# Patient Record
Sex: Female | Born: 1950 | Race: White | Hispanic: No | Marital: Married | Smoking: Former smoker
Health system: Southern US, Community
[De-identification: ages and names within clinical notes are randomized; demographics above are authoritative.]

## PROBLEM LIST (undated history)

## (undated) DIAGNOSIS — C4490 Unspecified malignant neoplasm of skin, unspecified: Secondary | ICD-10-CM

## (undated) DIAGNOSIS — C50919 Malignant neoplasm of unspecified site of unspecified female breast: Secondary | ICD-10-CM

## (undated) DIAGNOSIS — Z923 Personal history of irradiation: Secondary | ICD-10-CM

## (undated) HISTORY — PX: COLONOSCOPY: SHX174

## (undated) HISTORY — PX: BASAL CELL CARCINOMA EXCISION: SHX1214

## (undated) HISTORY — PX: BREAST LUMPECTOMY: SHX2

---

## 2000-06-16 DIAGNOSIS — C50919 Malignant neoplasm of unspecified site of unspecified female breast: Secondary | ICD-10-CM

## 2000-06-16 HISTORY — PX: BREAST LUMPECTOMY: SHX2

## 2000-06-16 HISTORY — PX: BREAST BIOPSY: SHX20

## 2000-06-16 HISTORY — DX: Malignant neoplasm of unspecified site of unspecified female breast: C50.919

## 2004-10-18 ENCOUNTER — Ambulatory Visit: Payer: Self-pay | Admitting: General Surgery

## 2005-11-17 ENCOUNTER — Ambulatory Visit: Payer: Self-pay | Admitting: General Surgery

## 2007-01-01 ENCOUNTER — Ambulatory Visit: Payer: Self-pay

## 2007-03-30 ENCOUNTER — Ambulatory Visit: Payer: Self-pay | Admitting: General Surgery

## 2007-05-10 ENCOUNTER — Ambulatory Visit: Payer: Self-pay | Admitting: Internal Medicine

## 2007-11-22 ENCOUNTER — Ambulatory Visit: Payer: Self-pay | Admitting: Family Medicine

## 2008-01-03 ENCOUNTER — Ambulatory Visit: Payer: Self-pay | Admitting: General Surgery

## 2008-01-17 ENCOUNTER — Ambulatory Visit: Payer: Self-pay | Admitting: General Surgery

## 2008-06-05 ENCOUNTER — Ambulatory Visit: Payer: Self-pay | Admitting: Internal Medicine

## 2009-01-10 ENCOUNTER — Ambulatory Visit: Payer: Self-pay | Admitting: General Surgery

## 2009-10-23 ENCOUNTER — Ambulatory Visit: Payer: Self-pay | Admitting: Family Medicine

## 2010-01-24 ENCOUNTER — Ambulatory Visit: Payer: Self-pay | Admitting: Family Medicine

## 2011-03-10 ENCOUNTER — Ambulatory Visit: Payer: Self-pay | Admitting: Family Medicine

## 2012-03-25 ENCOUNTER — Ambulatory Visit: Payer: Self-pay | Admitting: Family Medicine

## 2013-05-18 ENCOUNTER — Ambulatory Visit: Payer: Self-pay | Admitting: Family Medicine

## 2014-08-14 ENCOUNTER — Ambulatory Visit: Payer: Self-pay | Admitting: Family Medicine

## 2016-11-13 ENCOUNTER — Other Ambulatory Visit: Payer: Self-pay | Admitting: Family Medicine

## 2016-11-13 DIAGNOSIS — M8580 Other specified disorders of bone density and structure, unspecified site: Secondary | ICD-10-CM

## 2016-11-20 ENCOUNTER — Other Ambulatory Visit: Payer: Self-pay | Admitting: Family Medicine

## 2016-11-20 DIAGNOSIS — Z1231 Encounter for screening mammogram for malignant neoplasm of breast: Secondary | ICD-10-CM

## 2016-11-25 ENCOUNTER — Encounter: Payer: Self-pay | Admitting: Radiology

## 2016-11-25 ENCOUNTER — Ambulatory Visit
Admission: RE | Admit: 2016-11-25 | Discharge: 2016-11-25 | Disposition: A | Discharge status: Home / Self Care | Payer: Medicare HMO | Source: Ambulatory Visit | Attending: Family Medicine | Admitting: Family Medicine

## 2016-11-25 DIAGNOSIS — M8588 Other specified disorders of bone density and structure, other site: Secondary | ICD-10-CM | POA: Insufficient documentation

## 2016-11-25 DIAGNOSIS — M8580 Other specified disorders of bone density and structure, unspecified site: Secondary | ICD-10-CM | POA: Diagnosis present

## 2016-11-25 DIAGNOSIS — M85852 Other specified disorders of bone density and structure, left thigh: Secondary | ICD-10-CM | POA: Diagnosis not present

## 2016-11-25 DIAGNOSIS — Z1231 Encounter for screening mammogram for malignant neoplasm of breast: Secondary | ICD-10-CM | POA: Insufficient documentation

## 2016-11-25 HISTORY — DX: Personal history of irradiation: Z92.3

## 2016-11-25 HISTORY — DX: Malignant neoplasm of unspecified site of unspecified female breast: C50.919

## 2018-03-02 ENCOUNTER — Other Ambulatory Visit: Payer: Self-pay | Admitting: Family Medicine

## 2018-03-02 DIAGNOSIS — Z1231 Encounter for screening mammogram for malignant neoplasm of breast: Secondary | ICD-10-CM

## 2018-03-31 ENCOUNTER — Ambulatory Visit: Payer: Medicare HMO

## 2018-04-07 ENCOUNTER — Encounter (INDEPENDENT_AMBULATORY_CARE_PROVIDER_SITE_OTHER): Payer: Self-pay

## 2018-04-07 ENCOUNTER — Ambulatory Visit
Admission: RE | Admit: 2018-04-07 | Discharge: 2018-04-07 | Disposition: A | Discharge status: Home / Self Care | Payer: Medicare HMO | Source: Ambulatory Visit | Attending: Family Medicine | Admitting: Family Medicine

## 2018-04-07 DIAGNOSIS — Z1231 Encounter for screening mammogram for malignant neoplasm of breast: Secondary | ICD-10-CM | POA: Diagnosis not present

## 2018-04-07 HISTORY — DX: Unspecified malignant neoplasm of skin, unspecified: C44.90

## 2018-05-26 ENCOUNTER — Encounter: Payer: Self-pay | Admitting: *Deleted

## 2018-05-27 ENCOUNTER — Other Ambulatory Visit: Payer: Self-pay

## 2018-05-27 ENCOUNTER — Ambulatory Visit
Admission: RE | Admit: 2018-05-27 | Discharge: 2018-05-27 | Disposition: A | Discharge status: Home / Self Care | Payer: Medicare HMO | Source: Ambulatory Visit | Attending: Gastroenterology | Admitting: Gastroenterology

## 2018-05-27 ENCOUNTER — Encounter
Admission: RE | Disposition: A | Discharge status: Home / Self Care | Payer: Self-pay | Source: Ambulatory Visit | Attending: Gastroenterology

## 2018-05-27 ENCOUNTER — Ambulatory Visit: Payer: Medicare HMO | Admitting: Anesthesiology

## 2018-05-27 ENCOUNTER — Encounter: Payer: Self-pay | Admitting: *Deleted

## 2018-05-27 DIAGNOSIS — Z880 Allergy status to penicillin: Secondary | ICD-10-CM | POA: Diagnosis not present

## 2018-05-27 DIAGNOSIS — Z853 Personal history of malignant neoplasm of breast: Secondary | ICD-10-CM | POA: Diagnosis not present

## 2018-05-27 DIAGNOSIS — Z79899 Other long term (current) drug therapy: Secondary | ICD-10-CM | POA: Insufficient documentation

## 2018-05-27 DIAGNOSIS — Z7982 Long term (current) use of aspirin: Secondary | ICD-10-CM | POA: Diagnosis not present

## 2018-05-27 DIAGNOSIS — K5730 Diverticulosis of large intestine without perforation or abscess without bleeding: Secondary | ICD-10-CM | POA: Insufficient documentation

## 2018-05-27 DIAGNOSIS — K640 First degree hemorrhoids: Secondary | ICD-10-CM | POA: Diagnosis not present

## 2018-05-27 DIAGNOSIS — Z87891 Personal history of nicotine dependence: Secondary | ICD-10-CM | POA: Diagnosis not present

## 2018-05-27 DIAGNOSIS — Z85828 Personal history of other malignant neoplasm of skin: Secondary | ICD-10-CM | POA: Diagnosis not present

## 2018-05-27 DIAGNOSIS — R194 Change in bowel habit: Secondary | ICD-10-CM | POA: Diagnosis present

## 2018-05-27 HISTORY — PX: COLONOSCOPY WITH PROPOFOL: SHX5780

## 2018-05-27 SURGERY — COLONOSCOPY WITH PROPOFOL
Anesthesia: General

## 2018-05-27 MED ORDER — SODIUM CHLORIDE 0.9 % IV SOLN: INTRAVENOUS | Status: DC

## 2018-05-27 MED ORDER — LIDOCAINE HCL (PF) 2 % IJ SOLN
INTRAMUSCULAR | Status: AC
  Filled 2018-05-27: qty 10

## 2018-05-27 MED ORDER — SODIUM CHLORIDE 0.9 % IV SOLN
INTRAVENOUS | Status: DC
  Administered 2018-05-27: 08:00:00 via INTRAVENOUS

## 2018-05-27 MED ORDER — PROPOFOL 500 MG/50ML IV EMUL
INTRAVENOUS | Status: DC | PRN
  Administered 2018-05-27: 120 ug/kg/min via INTRAVENOUS

## 2018-05-27 MED ORDER — MIDAZOLAM HCL 2 MG/2ML IJ SOLN
INTRAMUSCULAR | Status: DC | PRN
  Administered 2018-05-27: 2 mg via INTRAVENOUS

## 2018-05-27 MED ORDER — FENTANYL CITRATE (PF) 100 MCG/2ML IJ SOLN
INTRAMUSCULAR | Status: DC | PRN
  Administered 2018-05-27: 50 ug via INTRAVENOUS

## 2018-05-27 MED ORDER — PROPOFOL 500 MG/50ML IV EMUL
INTRAVENOUS | Status: AC
  Filled 2018-05-27: qty 50

## 2018-05-27 MED ORDER — LIDOCAINE HCL (CARDIAC) PF 100 MG/5ML IV SOSY
PREFILLED_SYRINGE | INTRAVENOUS | Status: DC | PRN
  Administered 2018-05-27: 30 mg via INTRAVENOUS

## 2018-05-27 MED ORDER — FENTANYL CITRATE (PF) 100 MCG/2ML IJ SOLN
INTRAMUSCULAR | Status: AC
  Filled 2018-05-27: qty 2

## 2018-05-27 MED ORDER — MIDAZOLAM HCL 2 MG/2ML IJ SOLN
INTRAMUSCULAR | Status: AC
  Filled 2018-05-27: qty 2

## 2018-05-27 NOTE — Transfer of Care (Signed)
Immediate Anesthesia Transfer of Care Note  Patient: Mariah Meadows  Procedure(s) Performed: COLONOSCOPY WITH PROPOFOL (N/A )  Patient Location: PACU  Anesthesia Type:General  Level of Consciousness: awake and sedated  Airway & Oxygen Therapy: Patient Spontanous Breathing and Patient connected to nasal cannula oxygen  Post-op Assessment: Report given to RN and Post -op Vital signs reviewed and stable  Post vital signs: Reviewed and stable  Last Vitals:  Vitals Value Taken Time  BP    Temp    Pulse    Resp    SpO2      Last Pain:  Vitals:   05/27/18 0803  TempSrc: Tympanic         Complications: No apparent anesthesia complications

## 2018-05-27 NOTE — Op Note (Signed)
[facility] Gastroenterology Patient Name: Mariah Meadows Procedure Date: 05/27/2018 8:23 AM MRN: [id number] Account #: [id number] Date of Birth: 08-13-1950 Admit Type: Outpatient Age: 67 Room: [facility] ENDO ROOM 3 Gender: Female Note Status: Finalized Procedure:            Colonoscopy Indications:          Change in bowel habits, Change in stool caliber Providers:            Lollie Sails, MD Referring MD:         Gayland Curry MD, MD (Referring MD) Medicines:            Monitored Anesthesia Care Complications:        No immediate complications. Procedure:            Pre-Anesthesia Assessment:                       - ASA Grade Assessment: II - A patient with mild                        systemic disease.                       After obtaining informed consent, the colonoscope was                        passed under direct vision. Throughout the procedure,                        the patient's blood pressure, pulse, and oxygen                        saturations were monitored continuously. The was                        introduced through the anus and advanced to the the                        cecum, identified by appendiceal orifice and ileocecal                        valve. The colonoscopy was performed with moderate                        difficulty due to a tortuous colon. Successful                        completion of the procedure was aided by changing the                        patient to a supine position. The patient tolerated the                        procedure well. The quality of the bowel preparation                        was good. Findings:      A few small-mouthed diverticula were found in the sigmoid colon.      Non-bleeding internal hemorrhoids were found during retroflexion and       during anoscopy. The hemorrhoids were small and  Grade I (internal       hemorrhoids that do not prolapse).      The exam was otherwise without abnormality.    The perianal exam findings include a perianal rash.      The digital rectal exam was normal otherwise. Impression:           - Diverticulosis in the sigmoid colon.                       - Non-bleeding internal hemorrhoids.                       - The examination was otherwise normal.                       - Perianal rash found on perianal exam.                       - No specimens collected. Recommendation:       - Discharge patient to home.                       - Advance diet as tolerated.                       - Use Citrucel one tablespoon PO daily today.                       - Desitin cream applied externally after a bowel                        movement for 2 weeks.                       - Return to GI clinic in 3 weeks. consider treatment of                        hemorrhoid once rash is cleared                       - Diflucan (fluconazole) 100 mg PO daily for 5 days. Procedure Code(s):    --- Professional ---                       [phone], Colonoscopy, flexible; diagnostic, including                        collection of specimen(s) by brushing or washing, when                        performed (separate procedure) Diagnosis Code(s):    --- Professional ---                       K64.0, First degree hemorrhoids                       R21, Rash and other nonspecific skin eruption                       R19.4, Change in bowel habit                       R19.5, Other fecal  abnormalities                       K57.30, Diverticulosis of large intestine without                        perforation or abscess without bleeding CPT copyright 2018 [employer]. All rights reserved. The codes documented in this report are preliminary and upon coder review may  be revised to meet current compliance requirements. Lollie Sails, MD 05/27/2018 8:57:19 AM This report has been signed electronically. Number of Addenda: 0 Note Initiated On: 05/27/2018 8:23 AM Scope Withdrawal  Time: 0 hours 7 minutes 37 seconds  Total Procedure Duration: 0 hours 19 minutes 52 seconds       [facility]

## 2018-05-27 NOTE — Anesthesia Preprocedure Evaluation (Signed)
Anesthesia Evaluation  Patient identified by MRN, date of birth, ID band Patient awake    Reviewed: Allergy & Precautions, NPO status , Patient's Chart, lab work & pertinent test results  History of Anesthesia Complications Negative for: history of anesthetic complications  Airway Mallampati: II  TM Distance: >3 FB Neck ROM: Full    Dental  (+) Implants   Pulmonary neg sleep apnea, neg COPD, former smoker,    breath sounds clear to auscultation- rhonchi (-) wheezing      Cardiovascular Exercise Tolerance: Good (-) hypertension(-) CAD, (-) Past MI, (-) Cardiac Stents and (-) CABG  Rhythm:Regular Rate:Normal - Systolic murmurs and - Diastolic murmurs    Neuro/Psych neg Seizures negative neurological ROS  negative psych ROS   GI/Hepatic negative GI ROS, Neg liver ROS,   Endo/Other  negative endocrine ROSneg diabetes  Renal/GU negative Renal ROS     Musculoskeletal negative musculoskeletal ROS (+)   Abdominal (+) - obese,   Peds  Hematology negative hematology ROS (+)   Anesthesia Other Findings Past Medical History: 2002: Breast cancer ([place])     Comment:  lt lumpectomy/radiation No date: Personal history of radiation therapy No date: Skin cancer   Reproductive/Obstetrics                             Anesthesia Physical Anesthesia Plan  ASA: II  Anesthesia Plan: General   Post-op Pain Management:    Induction: Intravenous  PONV Risk Score and Plan: 2 and Propofol infusion  Airway Management Planned: Natural Airway  Additional Equipment:   Intra-op Plan:   Post-operative Plan:   Informed Consent: I have reviewed the patients History and Physical, chart, labs and discussed the procedure including the risks, benefits and alternatives for the proposed anesthesia with the patient or authorized representative who has indicated his/her understanding and acceptance.   Dental  advisory given  Plan Discussed with: CRNA and Anesthesiologist  Anesthesia Plan Comments:         Anesthesia Quick Evaluation

## 2018-05-27 NOTE — Anesthesia Post-op Follow-up Note (Signed)
Anesthesia QCDR form completed.        

## 2018-05-27 NOTE — Anesthesia Postprocedure Evaluation (Signed)
Anesthesia Post Note  Patient: Mariah Meadows  Procedure(s) Performed: COLONOSCOPY WITH PROPOFOL (N/A )  Patient location during evaluation: Endoscopy Anesthesia Type: General Level of consciousness: awake and alert and oriented Pain management: pain level controlled Vital Signs Assessment: post-procedure vital signs reviewed and stable Respiratory status: spontaneous breathing, nonlabored ventilation and respiratory function stable Cardiovascular status: blood pressure returned to baseline and stable Postop Assessment: no signs of nausea or vomiting Anesthetic complications: no     Last Vitals:  Vitals:   05/27/18 0916 05/27/18 0926  BP: 99/86 (!) 120/58  Pulse: 73 68  Resp: (!) 22 14  Temp:    SpO2: 99% 100%    Last Pain:  Vitals:   05/27/18 0926  TempSrc:   PainSc: 0-No pain                 Jaimie Pippins

## 2018-05-27 NOTE — Anesthesia Procedure Notes (Signed)
Performed by: Cook-Martin, Daylin Gruszka Pre-anesthesia Checklist: Patient identified, Emergency Drugs available, Suction available, Patient being monitored and Timeout performed Patient Re-evaluated:Patient Re-evaluated prior to induction Oxygen Delivery Method: Nasal cannula Preoxygenation: Pre-oxygenation with 100% oxygen Induction Type: IV induction Placement Confirmation: positive ETCO2 and CO2 detector       

## 2018-05-27 NOTE — H&P (Signed)
Outpatient short stay form Pre-procedure 05/27/2018 8:11 AM Lollie Sails MD  Primary Physician: Dr. Gayland Curry  Reason for visit: Colonoscopy  History of present illness: Patient is a 67 year old female presenting today as above.  She has noted a change of bowel habits and stool caliber and is some occasional blood on toilet paper over the.  Past year.  He also seems to be having increased problems with constipation.  No abdominal pain or diarrhea.  She tolerated her prep well.  She takes no aspirin or blood thinning agent with the exception of 81 mg aspirin that has been held.    Current Facility-Administered Medications:  .  0.9 %  sodium chloride infusion, , Intravenous, Continuous, Lollie Sails, MD .  0.9 %  sodium chloride infusion, , Intravenous, Continuous, Lollie Sails, MD  Medications Prior to Admission  Medication Sig Dispense Refill Last Dose  . acetaminophen (TYLENOL) 500 MG tablet Take 500 mg by mouth every 6 (six) hours as needed.   Past Week at Unknown time  . aspirin 81 MG chewable tablet Chew 81 mg by mouth daily.     . Cholecalciferol 1.25 MG (50000 UT) capsule Take 50,000 Units by mouth daily.   05/26/2018 at 0600  . clobetasol (TEMOVATE) 0.05 % GEL Apply 0.05 % topically 2 (two) times daily.   Past Week at Unknown time     Allergies  Allergen Reactions  . Penicillins      Past Medical History:  Diagnosis Date  . Breast cancer ([place]) 2002   lt lumpectomy/radiation  . Personal history of radiation therapy   . Skin cancer     Review of systems:      Physical Exam    Heart and lungs: Regular rate and rhythm without rub or gallop, lungs are bilaterally clear.    HEENT: Normocephalic atraumatic eyes are anicteric    Other:    Pertinant exam for procedure: Soft nontender nondistended bowel sounds positive normoactive.    Planned proceedures: Colonoscopy and indicated procedures. I have discussed the risks benefits and  complications of procedures to include not limited to bleeding, infection, perforation and the risk of sedation and the patient wishes to proceed.    Lollie Sails, MD Gastroenterology 05/27/2018  8:11 AM

## 2019-06-20 ENCOUNTER — Other Ambulatory Visit: Payer: Self-pay | Admitting: Family Medicine

## 2019-07-27 ENCOUNTER — Other Ambulatory Visit: Payer: Self-pay | Admitting: Family Medicine

## 2019-07-27 DIAGNOSIS — Z1231 Encounter for screening mammogram for malignant neoplasm of breast: Secondary | ICD-10-CM

## 2019-07-27 DIAGNOSIS — Z8739 Personal history of other diseases of the musculoskeletal system and connective tissue: Secondary | ICD-10-CM

## 2019-07-27 DIAGNOSIS — Z0000 Encounter for general adult medical examination without abnormal findings: Secondary | ICD-10-CM

## 2019-08-02 ENCOUNTER — Ambulatory Visit
Admission: RE | Admit: 2019-08-02 | Discharge: 2019-08-02 | Disposition: A | Discharge status: Home / Self Care | Payer: Medicare HMO | Source: Ambulatory Visit | Attending: Family Medicine | Admitting: Family Medicine

## 2019-08-02 ENCOUNTER — Other Ambulatory Visit: Payer: Self-pay

## 2019-08-02 DIAGNOSIS — Z1231 Encounter for screening mammogram for malignant neoplasm of breast: Secondary | ICD-10-CM | POA: Diagnosis present

## 2019-08-02 DIAGNOSIS — M8589 Other specified disorders of bone density and structure, multiple sites: Secondary | ICD-10-CM | POA: Diagnosis not present

## 2019-08-02 DIAGNOSIS — Z8739 Personal history of other diseases of the musculoskeletal system and connective tissue: Secondary | ICD-10-CM

## 2019-08-02 DIAGNOSIS — Z0000 Encounter for general adult medical examination without abnormal findings: Secondary | ICD-10-CM

## 2019-10-10 ENCOUNTER — Other Ambulatory Visit: Payer: Self-pay | Admitting: Physician Assistant

## 2019-10-10 DIAGNOSIS — H93A9 Pulsatile tinnitus, unspecified ear: Secondary | ICD-10-CM

## 2019-10-13 ENCOUNTER — Other Ambulatory Visit: Payer: Self-pay

## 2019-10-13 ENCOUNTER — Other Ambulatory Visit
Admission: RE | Admit: 2019-10-13 | Discharge: 2019-10-13 | Disposition: A | Discharge status: Home / Self Care | Payer: Medicare HMO | Source: Ambulatory Visit | Attending: Physician Assistant | Admitting: Physician Assistant

## 2019-10-13 ENCOUNTER — Ambulatory Visit
Admission: RE | Admit: 2019-10-13 | Discharge: 2019-10-13 | Disposition: A | Discharge status: Home / Self Care | Payer: Medicare HMO | Source: Ambulatory Visit | Attending: Physician Assistant | Admitting: Physician Assistant

## 2019-10-13 DIAGNOSIS — H93A9 Pulsatile tinnitus, unspecified ear: Secondary | ICD-10-CM | POA: Diagnosis present

## 2019-10-13 LAB — CREATININE, SERUM
Creatinine, Ser: 0.73 mg/dL (ref 0.44–1.00)
GFR calc Af Amer: >60 mL/min (ref >60)
GFR calc non Af Amer: >60 mL/min (ref >60)

## 2019-10-13 MED ORDER — IOHEXOL 350 MG/ML SOLN
75.0000 mL | Freq: Once | INTRAVENOUS | Status: AC | PRN
  Administered 2019-10-13: 75 mL via INTRAVENOUS

## 2019-11-02 ENCOUNTER — Other Ambulatory Visit: Payer: Self-pay | Admitting: Family Medicine

## 2019-11-02 DIAGNOSIS — E041 Nontoxic single thyroid nodule: Secondary | ICD-10-CM

## 2019-11-04 ENCOUNTER — Other Ambulatory Visit: Payer: Self-pay | Admitting: Family Medicine

## 2019-11-04 DIAGNOSIS — E041 Nontoxic single thyroid nodule: Secondary | ICD-10-CM

## 2019-11-04 DIAGNOSIS — N889 Noninflammatory disorder of cervix uteri, unspecified: Secondary | ICD-10-CM

## 2019-11-21 ENCOUNTER — Other Ambulatory Visit: Payer: Self-pay

## 2019-11-21 ENCOUNTER — Ambulatory Visit
Admission: RE | Admit: 2019-11-21 | Discharge: 2019-11-21 | Disposition: A | Discharge status: Home / Self Care | Payer: Medicare HMO | Source: Ambulatory Visit | Attending: Family Medicine | Admitting: Family Medicine

## 2019-11-21 DIAGNOSIS — E041 Nontoxic single thyroid nodule: Secondary | ICD-10-CM | POA: Insufficient documentation

## 2019-11-21 DIAGNOSIS — N889 Noninflammatory disorder of cervix uteri, unspecified: Secondary | ICD-10-CM | POA: Insufficient documentation

## 2019-11-21 MED ORDER — GADOBUTROL 1 MMOL/ML IV SOLN
5.0000 mL | Freq: Once | INTRAVENOUS | Status: AC | PRN
  Administered 2019-11-21: 5 mL via INTRAVENOUS

## 2020-09-10 ENCOUNTER — Other Ambulatory Visit: Payer: Self-pay | Admitting: Family Medicine

## 2020-09-10 DIAGNOSIS — Z1231 Encounter for screening mammogram for malignant neoplasm of breast: Secondary | ICD-10-CM

## 2020-09-25 ENCOUNTER — Ambulatory Visit
Admission: RE | Admit: 2020-09-25 | Discharge: 2020-09-25 | Disposition: A | Discharge status: Home / Self Care | Payer: Medicare HMO | Source: Ambulatory Visit | Attending: Family Medicine | Admitting: Family Medicine

## 2020-09-25 ENCOUNTER — Other Ambulatory Visit: Payer: Self-pay

## 2020-09-25 DIAGNOSIS — Z1231 Encounter for screening mammogram for malignant neoplasm of breast: Secondary | ICD-10-CM | POA: Diagnosis present

## 2021-03-21 IMAGING — CT CT ANGIO HEAD
2 of 5 series · 7 of 14 positions shown · IV contrast (APPLIED)
Comparison: None.

CLINICAL DATA: Pulsatile tinnitus.

EXAM:
CT ANGIOGRAPHY HEAD AND NECK
TECHNIQUE: Multidetector CT imaging of the head and neck was performed using
the standard protocol during bolus administration of intravenous
contrast. Multiplanar CT image reconstructions and MIPs were
obtained to evaluate the vascular anatomy. Carotid stenosis
measurements (when applicable) are obtained utilizing NASCET
criteria, using the distal internal carotid diameter as the
denominator.
CONTRAST:  75mL OMNIPAQUE IOHEXOL 350 MG/ML SOLN

[Series 4: cta neck · axial · 0.53mm/px · z∈[-216,-92]mm · 2 of 179 slices shown]
[im 60/179  soft-tissue]
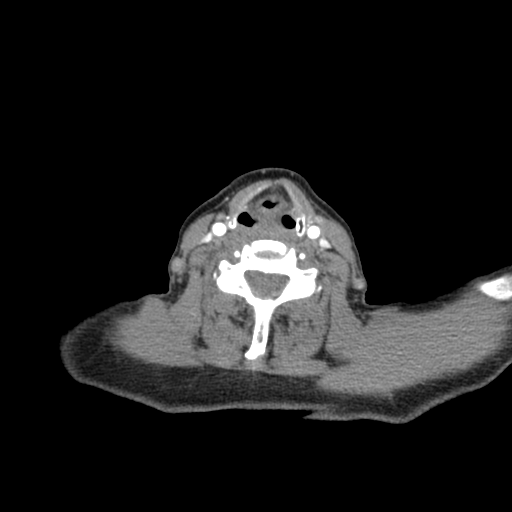
[im 119/179  soft-tissue]
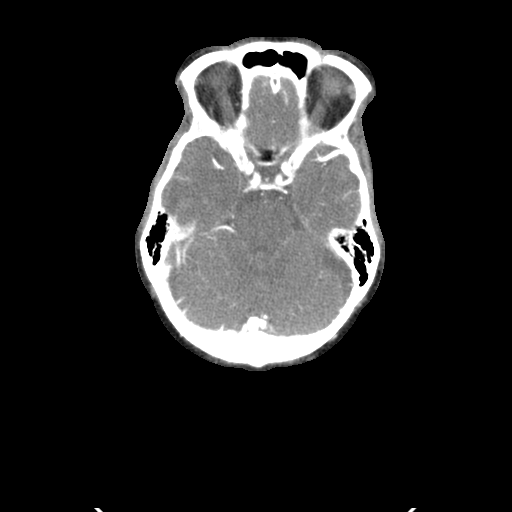

[Series 10: ax thin · axial · 0.39mm/px · z∈[-275,-31]mm · 5 of 366 slices shown]
[im 61/366  soft-tissue]
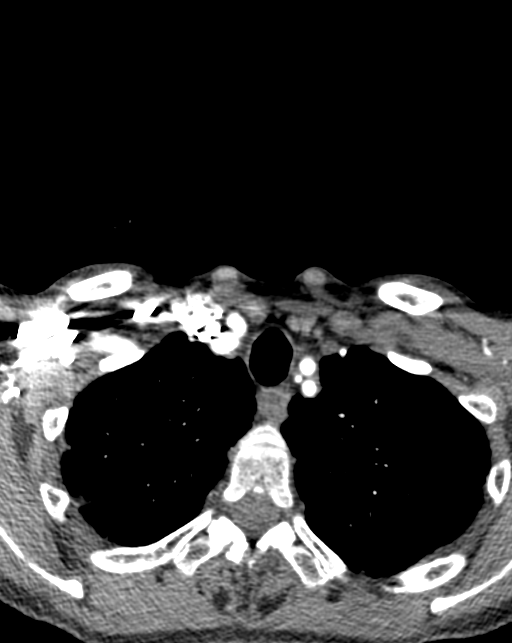
[im 122/366  bone]
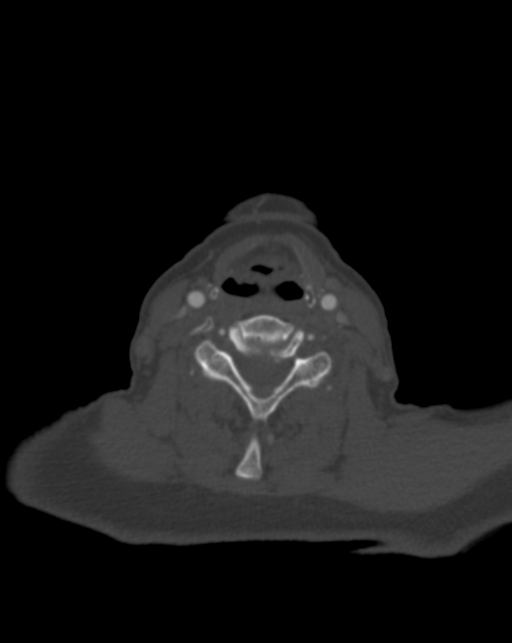
[im 183/366  soft-tissue]
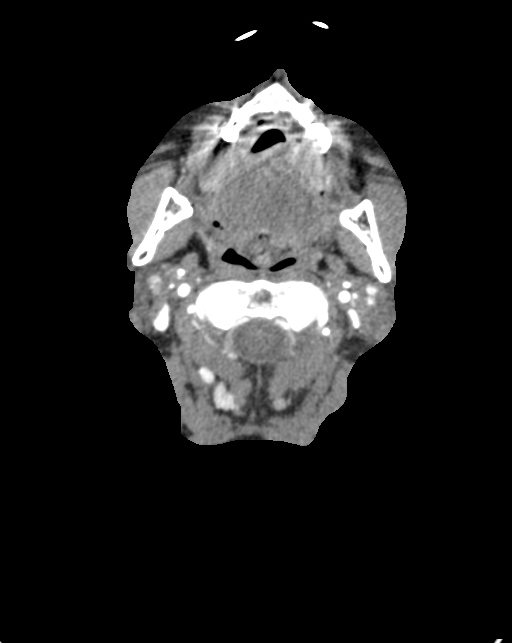
[im 244/366  bone]
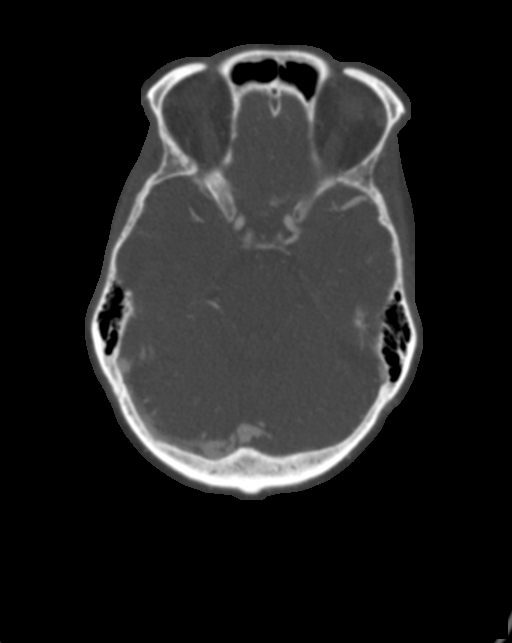
[im 305/366  soft-tissue]
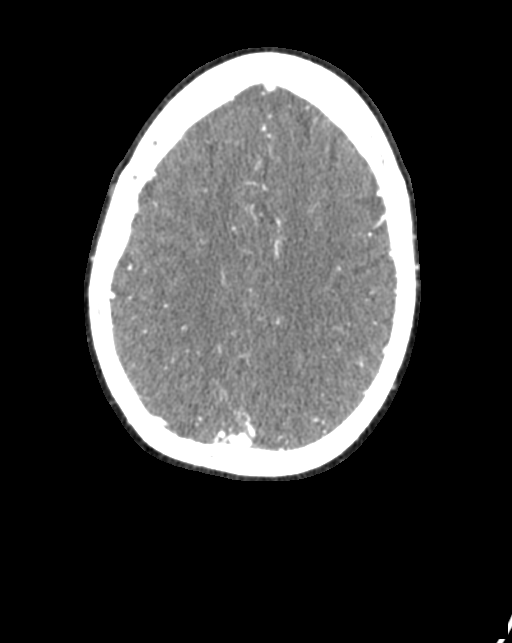

[7 of 14 positions shown; findings below may reference images not displayed]

FINDINGS: CT HEAD FINDINGS

Brain: No evidence of acute infarction, hemorrhage, hydrocephalus,
extra-axial collection or mass lesion/mass effect.

Vascular: Small calcified plaques are seen in the bilateral carotid
siphons.

Skull: No fracture or destructive lesion. Mastoids and middle ears
are clear.

Sinuses: Imaged portions are clear.

Orbits: No acute finding.

Review of the MIP images confirms the above findings

CTA NECK FINDINGS

Aortic arch: Calcific atherosclerotic plaques are noted in the
aortic arch and at the origin of the major neck arteries without
significant stenosis. The left vertebral artery origin is directly
from the aortic arch between the left common carotid and left
subclavian artery. Imaged portion shows no evidence of aneurysm or
dissection.

Right carotid system: Mild atherosclerotic disease noted with soft
plaque in the right common carotid artery and mixed density plaque
at the distal bulb without significant stenosis.

Left carotid system: No evidence of dissection, stenosis or
occlusion. Minimal luminal irregularity in the mid cervical segment
is likely related to mild atherosclerotic disease.

Vertebral arteries: Codominant. Both vertebral arteries are somewhat
small in caliber, likely developmental related to bilateral fetal
origin of the posterior cerebral arteries. Mild irregularity of the
proximal left vertebral artery may represent mild atherosclerotic
disease and/or artifact related to motion.

Skeleton: A 6 mm well circumscribe lucent C6 vertebral body lesion,
indeterminate.

Other neck: Multiple hypodense thyroid nodules, the largest in the
right lobe measuring 7 mm.

Upper chest: Bilateral apical scarring and centrilobular emphysema.

Review of the MIP images confirms the above findings

CTA HEAD FINDINGS

Anterior circulation: No significant stenosis, proximal occlusion,
aneurysm, or vascular malformation. Notice made of the bilateral
posterior cerebral arteries originating directly from the
corresponding internal carotid arteries.

Posterior circulation: No occlusion, aneurysm or vascular
malformation. The basilar artery is diminutive in caliber,
particularly distal to the origin of the bilateral superior
cerebellar arteries with a severely hypoplastic right P1 segment and
severely hypoplastic or aplastic left P1. The diminutive caliber is
likely developmental related to persistent bilateral fetal
circulation (bilateral PCA originating directly from the
corresponding ICA). Extradural origin of the right PICA is noted.

Venous sinuses: As permitted by contrast timing, patent, noting a
hypoplastic left transverse and sigmoid sinuses.

Anatomic variants: Bilateral fetal PCA, direct origin of the left
vertebral artery from the aortic arch.

Review of the MIP images confirms the above findings
IMPRESSION: 1. No large vessel occlusion, aneurysm, or vascular malformation in
the head or neck.
2. Diminutive caliber of the vertebral arteries and basilar artery,
likely developmental related to persistent bilateral fetal
circulation (bilateral PCA originating directly from the
corresponding internal carotid arteries).
3. A 6 mm well circumscribe lucent C6 vertebral body lesion,
indeterminate. Consider follow-up with MRI with contrast, as
clinically warranted.
4. Emphysema and mild aortic atherosclerosis.

Aortic Atherosclerosis (JMAKK-06D.D) and Emphysema (JMAKK-G36.S).

## 2021-08-14 ENCOUNTER — Other Ambulatory Visit: Payer: Self-pay | Admitting: Family Medicine

## 2021-08-14 DIAGNOSIS — Z1231 Encounter for screening mammogram for malignant neoplasm of breast: Secondary | ICD-10-CM

## 2021-08-14 DIAGNOSIS — Z780 Asymptomatic menopausal state: Secondary | ICD-10-CM

## 2021-10-07 ENCOUNTER — Ambulatory Visit
Admission: RE | Admit: 2021-10-07 | Discharge: 2021-10-07 | Disposition: A | Discharge status: Home / Self Care | Payer: Medicare HMO | Source: Ambulatory Visit | Attending: Family Medicine | Admitting: Family Medicine

## 2021-10-07 DIAGNOSIS — Z853 Personal history of malignant neoplasm of breast: Secondary | ICD-10-CM | POA: Diagnosis not present

## 2021-10-07 DIAGNOSIS — Z13820 Encounter for screening for osteoporosis: Secondary | ICD-10-CM | POA: Diagnosis not present

## 2021-10-07 DIAGNOSIS — Z780 Asymptomatic menopausal state: Secondary | ICD-10-CM | POA: Insufficient documentation

## 2021-10-07 DIAGNOSIS — M8589 Other specified disorders of bone density and structure, multiple sites: Secondary | ICD-10-CM | POA: Diagnosis not present

## 2021-10-07 DIAGNOSIS — Z1231 Encounter for screening mammogram for malignant neoplasm of breast: Secondary | ICD-10-CM | POA: Diagnosis present

## 2022-02-03 ENCOUNTER — Encounter: Payer: Self-pay | Admitting: Emergency Medicine

## 2022-02-03 ENCOUNTER — Other Ambulatory Visit: Payer: Self-pay

## 2022-02-03 ENCOUNTER — Emergency Department
Admission: EM | Admit: 2022-02-03 | Discharge: 2022-02-03 | Disposition: A | Discharge status: Home / Self Care | Payer: Medicare HMO | Attending: Emergency Medicine | Admitting: Emergency Medicine

## 2022-02-03 ENCOUNTER — Emergency Department: Payer: Medicare HMO

## 2022-02-03 DIAGNOSIS — R55 Syncope and collapse: Secondary | ICD-10-CM

## 2022-02-03 DIAGNOSIS — S0990XA Unspecified injury of head, initial encounter: Secondary | ICD-10-CM | POA: Diagnosis present

## 2022-02-03 DIAGNOSIS — S0012XA Contusion of left eyelid and periocular area, initial encounter: Secondary | ICD-10-CM | POA: Diagnosis not present

## 2022-02-03 DIAGNOSIS — X58XXXA Exposure to other specified factors, initial encounter: Secondary | ICD-10-CM | POA: Diagnosis not present

## 2022-02-03 DIAGNOSIS — S060X9A Concussion with loss of consciousness of unspecified duration, initial encounter: Secondary | ICD-10-CM | POA: Insufficient documentation

## 2022-02-03 LAB — CBC
HCT: 39.9 % (ref 36.0–46.0)
Hemoglobin: 12.7 g/dL (ref 12.0–15.0)
MCH: 29.1 pg (ref 26.0–34.0)
MCHC: 31.8 g/dL (ref 30.0–36.0)
MCV: 91.3 fL (ref 80.0–100.0)
Platelets: 237 10*3/uL (ref 150–400)
RBC: 4.37 MIL/uL (ref 3.87–5.11)
RDW: 12.1 % (ref 11.5–15.5)
WBC: 7.2 10*3/uL (ref 4.0–10.5)
nRBC: 0 % (ref 0.0–0.2)

## 2022-02-03 LAB — BASIC METABOLIC PANEL
Anion gap: 6 (ref 5–15)
BUN: 17 mg/dL (ref 8–23)
CO2: 26 mmol/L (ref 22–32)
Calcium: 10.1 mg/dL (ref 8.9–10.3)
Chloride: 107 mmol/L (ref 98–111)
Creatinine, Ser: 0.93 mg/dL (ref 0.44–1.00)
GFR, Estimated: >60 mL/min (ref >60)
Glucose, Bld: 105 mg/dL — ABNORMAL HIGH (ref 70–99)
Potassium: 4.2 mmol/L (ref 3.5–5.1)
Sodium: 139 mmol/L (ref 135–145)

## 2022-02-03 NOTE — ED Provider Notes (Signed)
[facility] Provider Note    Event Date/Time   First MD Initiated Contact with Patient 02/03/22 1101     (approximate)   History   Chief Complaint Loss of Consciousness   HPI Mariah Meadows is a 71 y.o. female, history of hypercholesterolemia, presents emergency department for evaluation of syncope/fall that occurred 5 days ago.  Patient states that she has been having significant constipation recently.  On Thursday, she reportedly took a few different laxative/stool softeners and went to the bathroom to have a bowel movement.  When she was straining, she states that she felt a lot of pain, which reportedly caused her to pass out and hit her head/face on the bathtub.  She states that she did not lose consciousness, but she does not believe that it was for a long time.  Since then, she has felt mostly fine, however a couple days ago she did begin to have a headache and mild nausea.  She was advised by her regular doctor to report to the emergency department for further evaluation.  Denies vision changes, fever/chills, chest pain, neck pain, shortness of breath, numb/tingling upper or lower extremities, hearing changes, or vertigo.  History Limitations: No limitations        Physical Exam  Triage Vital Signs: ED Triage Vitals  Enc Vitals Group     BP 02/03/22 1023 138/71     Pulse Rate 02/03/22 1023 79     Resp 02/03/22 1023 18     Temp 02/03/22 1023 98.5 F (36.9 C)     Temp Source 02/03/22 1023 Oral     SpO2 02/03/22 1023 97 %     Weight 02/03/22 1024 125 lb (56.7 kg)     Height 02/03/22 1024 '5\' 3"'$  (1.6 m)     Head Circumference --      Peak Flow --      Pain Score 02/03/22 1024 0     Pain Loc --      Pain Edu? --      Excl. in [place]? --     Most recent vital signs: Vitals:   02/03/22 1023  BP: 138/71  Pulse: 79  Resp: 18  Temp: 98.5 F (36.9 C)  SpO2: 97%    General: Awake, NAD.  Skin: Warm, dry. No rashes or lesions.  Eyes: PERRL.  Conjunctivae normal.  EOMI CV: Good peripheral perfusion.  Resp: Normal effort.  Abd: Soft, non-tender. No distention.  Neuro: At baseline. No gross neurological deficits.  Cranial nerves II through XII intact.  Cerebellar function intact.  Normal finger-nose testing.  5/5 strength and sensation in upper and lower extremities.  Focused Exam: Mild ecchymosis on the infraorbital region.  Mild cuts/abrasions to the bottom lip.  Normal dentition.  No obvious maxillofacial deformities.  Physical Exam    ED Results / Procedures / Treatments  Labs (all labs ordered are listed, but only abnormal results are displayed) Labs Reviewed  BASIC METABOLIC PANEL - Abnormal; Notable for the following components:      Result Value   Glucose, Bld 105 (*)    All other components within normal limits  CBC  URINALYSIS, ROUTINE W REFLEX MICROSCOPIC  CBG MONITORING, ED     EKG Sinus rhythm, rate of 79, no T-segment changes, no AV blocks, normal QRS interval, no QT prolongation.   RADIOLOGY  ED Provider Interpretation: I personally viewed and interpreted this head CT, no evidence of acute intracranial abnormalities.  CT HEAD WO CONTRAST  Result Date: 02/03/2022 CLINICAL DATA:  Head tightness.  Bruising around left eye. EXAM: CT HEAD WITHOUT CONTRAST TECHNIQUE: Contiguous axial images were obtained from the base of the skull through the vertex without intravenous contrast. RADIATION DOSE REDUCTION: This exam was performed according to the departmental dose-optimization program which includes automated exposure control, adjustment of the mA and/or kV according to patient size and/or use of iterative reconstruction technique. COMPARISON:  10/13/2019 FINDINGS: Brain: There is no evidence for acute hemorrhage, hydrocephalus, mass lesion, or abnormal extra-axial fluid collection. No definite CT evidence for acute infarction. Vascular: No hyperdense vessel or unexpected calcification. Skull: No evidence for  fracture. No worrisome lytic or sclerotic lesion. Sinuses/Orbits: The visualized paranasal sinuses and mastoid air cells are clear. Visualized portions of the globes and intraorbital fat are unremarkable. Other: None. IMPRESSION: No acute intracranial abnormality. Electronically Signed   By: Misty Stanley M.D.   On: 02/03/2022 10:40    PROCEDURES:  Critical Care performed: N/A.  Procedures    MEDICATIONS ORDERED IN ED: Medications - No data to display   IMPRESSION / MDM / [place] / ED COURSE  I reviewed the triage vital signs and the nursing notes.                              Differential diagnosis includes, but is not limited to, concussion, postconcussive syndrome, abdominal subdural hematoma, subarachnoid hemorrhage, cardiogenic syncope, vasovagal syncope, constipation  ED Course Patient appears well, vitals within normal limits.  NAD.  CBC shows no leukocytosis or anemia.  BMP shows no electrolyte abnormalities or AKI.  Assessment/Plan Patient presents with syncope/fall with head injury that occurred 5 days prior.  She has been asymptomatic, with the exception of the past couple days for she has developed mild headache and nausea.  I suspect that her new symptoms likely related to postconcussive syndrome secondary to head injury on the bathtub.  Head CT reassuring for no evidence of intracranial abnormalities.  In regards to her syncope, I suspect is likely vasovagal, especially given that she was in the middle of excessive straining with bowel movement at the time.  Her EKG is unremarkable, however we will provide her with a referral over to cardiology for further evaluation and definitive rule out of cardiogenic pathology.  Will discharge  Considered admission for this patient, but given her stable presentation and access to close follow-up, she is unlikely to benefit from admission.  Provided the patient with anticipatory guidance, return precautions, and  educational material. Encouraged the patient to return to the emergency department at any time if they begin to experience any new or worsening symptoms. Patient expressed understanding and agreed with the plan.   Patient's presentation is most consistent with acute complicated illness / injury requiring diagnostic workup.       FINAL CLINICAL IMPRESSION(S) / ED DIAGNOSES   Final diagnoses:  Syncope, unspecified syncope type  Concussion with loss of consciousness, initial encounter     Rx / DC Orders   ED Discharge Orders          Ordered    Ambulatory referral to Cardiology       Comments: If you have not heard from the Cardiology office within the next 72 hours please call [phone].   02/03/22 1135             Note:  This document was prepared using Dragon voice recognition software and may include unintentional dictation  errors.   Teodoro Spray, [place] 02/03/22 1137    Blake Divine, MD 02/03/22 [id number]

## 2022-02-03 NOTE — ED Triage Notes (Signed)
Patient to ED via POV for syncopal episode on Thursday. Patient is now c/o "head tightness" and new bruising around left eye. Patient states she does take a low dose of aspirin. Ambulatory for triage. AOx4

## 2022-02-03 NOTE — Discharge Instructions (Addendum)
-  Review the educational material about concussions.  Follow-up with your primary care provider as needed.  -You will be contacted in regards to setting up a cardiology appointment.  If they do not contact you in 24 to 48 hours, you may call the number listed in these instructions.  -Return to the emergency department anytime if you begin to experience any new or worsening symptoms.

## 2022-09-09 ENCOUNTER — Other Ambulatory Visit: Payer: Self-pay | Admitting: Family Medicine

## 2022-09-09 DIAGNOSIS — Z1231 Encounter for screening mammogram for malignant neoplasm of breast: Secondary | ICD-10-CM

## 2022-10-20 ENCOUNTER — Ambulatory Visit
Admission: RE | Admit: 2022-10-20 | Discharge: 2022-10-20 | Disposition: A | Discharge status: Home / Self Care | Payer: Medicare HMO | Source: Ambulatory Visit | Attending: Family Medicine | Admitting: Family Medicine

## 2022-10-20 DIAGNOSIS — Z1231 Encounter for screening mammogram for malignant neoplasm of breast: Secondary | ICD-10-CM | POA: Insufficient documentation

## 2023-08-27 ENCOUNTER — Other Ambulatory Visit: Payer: Self-pay | Admitting: Family Medicine

## 2023-08-27 DIAGNOSIS — E7800 Pure hypercholesterolemia, unspecified: Secondary | ICD-10-CM

## 2023-08-27 DIAGNOSIS — Z1231 Encounter for screening mammogram for malignant neoplasm of breast: Secondary | ICD-10-CM

## 2023-08-27 DIAGNOSIS — Z780 Asymptomatic menopausal state: Secondary | ICD-10-CM

## 2023-10-21 ENCOUNTER — Ambulatory Visit
Admission: RE | Admit: 2023-10-21 | Discharge: 2023-10-21 | Disposition: A | Discharge status: Home / Self Care | Source: Ambulatory Visit | Attending: Family Medicine | Admitting: Family Medicine

## 2023-10-21 DIAGNOSIS — E7800 Pure hypercholesterolemia, unspecified: Secondary | ICD-10-CM | POA: Insufficient documentation

## 2023-10-21 DIAGNOSIS — Z1231 Encounter for screening mammogram for malignant neoplasm of breast: Secondary | ICD-10-CM | POA: Insufficient documentation

## 2023-10-21 DIAGNOSIS — Z780 Asymptomatic menopausal state: Secondary | ICD-10-CM | POA: Insufficient documentation

## 2024-01-06 ENCOUNTER — Other Ambulatory Visit: Payer: Self-pay

## 2024-01-06 ENCOUNTER — Emergency Department

## 2024-01-06 ENCOUNTER — Emergency Department
Admission: EM | Admit: 2024-01-06 | Discharge: 2024-01-06 | Disposition: A | Discharge status: Home / Self Care | Attending: Emergency Medicine | Admitting: Emergency Medicine

## 2024-01-06 DIAGNOSIS — Z853 Personal history of malignant neoplasm of breast: Secondary | ICD-10-CM | POA: Insufficient documentation

## 2024-01-06 DIAGNOSIS — W010XXA Fall on same level from slipping, tripping and stumbling without subsequent striking against object, initial encounter: Secondary | ICD-10-CM | POA: Insufficient documentation

## 2024-01-06 DIAGNOSIS — S52532B Colles' fracture of left radius, initial encounter for open fracture type I or II: Secondary | ICD-10-CM | POA: Insufficient documentation

## 2024-01-06 DIAGNOSIS — S6992XA Unspecified injury of left wrist, hand and finger(s), initial encounter: Secondary | ICD-10-CM | POA: Diagnosis present

## 2024-01-06 DIAGNOSIS — Z23 Encounter for immunization: Secondary | ICD-10-CM | POA: Insufficient documentation

## 2024-01-06 DIAGNOSIS — Y9373 Activity, racquet and hand sports: Secondary | ICD-10-CM | POA: Insufficient documentation

## 2024-01-06 LAB — CBC WITH DIFFERENTIAL/PLATELET
Abs Immature Granulocytes: 0.02 K/uL (ref 0.00–0.07)
Basophils Absolute: 0.1 K/uL (ref 0.0–0.1)
Basophils Relative: 1 %
Eosinophils Absolute: 0.2 K/uL (ref 0.0–0.5)
Eosinophils Relative: 2 %
HCT: 38.6 % (ref 36.0–46.0)
Hemoglobin: 12.6 g/dL (ref 12.0–15.0)
Immature Granulocytes: 0 %
Lymphocytes Relative: 30 %
Lymphs Abs: 3.1 K/uL (ref 0.7–4.0)
MCH: 30.1 pg (ref 26.0–34.0)
MCHC: 32.6 g/dL (ref 30.0–36.0)
MCV: 92.1 fL (ref 80.0–100.0)
Monocytes Absolute: 0.9 K/uL (ref 0.1–1.0)
Monocytes Relative: 8 %
Neutro Abs: 6.1 K/uL (ref 1.7–7.7)
Neutrophils Relative %: 59 %
Platelets: 227 K/uL (ref 150–400)
RBC: 4.19 MIL/uL (ref 3.87–5.11)
RDW: 12 % (ref 11.5–15.5)
WBC: 10.3 K/uL (ref 4.0–10.5)
nRBC: 0 % (ref 0.0–0.2)

## 2024-01-06 LAB — BASIC METABOLIC PANEL WITH GFR
Anion gap: 9 (ref 5–15)
BUN: 25 mg/dL — ABNORMAL HIGH (ref 8–23)
CO2: 26 mmol/L (ref 22–32)
Calcium: 10.3 mg/dL (ref 8.9–10.3)
Chloride: 105 mmol/L (ref 98–111)
Creatinine, Ser: 0.76 mg/dL (ref 0.44–1.00)
GFR, Estimated: >60 mL/min (ref >60)
Glucose, Bld: 101 mg/dL — ABNORMAL HIGH (ref 70–99)
Potassium: 4.1 mmol/L (ref 3.5–5.1)
Sodium: 140 mmol/L (ref 135–145)

## 2024-01-06 MED ORDER — CEPHALEXIN 500 MG PO CAPS
500.0000 mg | ORAL_CAPSULE | Freq: Once | ORAL | Status: AC
  Administered 2024-01-06: 500 mg via ORAL
  Filled 2024-01-06: qty 1

## 2024-01-06 MED ORDER — IBUPROFEN 400 MG PO TABS: 400.0000 mg | ORAL_TABLET | Freq: Once | ORAL | Status: DC

## 2024-01-06 MED ORDER — MORPHINE SULFATE (PF) 4 MG/ML IV SOLN
4.0000 mg | Freq: Once | INTRAVENOUS | Status: AC
  Administered 2024-01-06: 4 mg via INTRAVENOUS
  Filled 2024-01-06: qty 1

## 2024-01-06 MED ORDER — OXYCODONE-ACETAMINOPHEN 5-325 MG PO TABS
1.0000 | ORAL_TABLET | Freq: Once | ORAL | Status: AC
  Administered 2024-01-06: 1 via ORAL
  Filled 2024-01-06: qty 1

## 2024-01-06 MED ORDER — CEPHALEXIN 500 MG PO CAPS: 500.0000 mg | ORAL_CAPSULE | Freq: Four times a day (QID) | ORAL | 0 refills | Status: AC

## 2024-01-06 MED ORDER — LIDOCAINE 5 % EX PTCH: 1.0000 | MEDICATED_PATCH | CUTANEOUS | Status: DC

## 2024-01-06 MED ORDER — OXYCODONE-ACETAMINOPHEN 5-325 MG PO TABS: 1.0000 | ORAL_TABLET | ORAL | 0 refills | Status: DC | PRN

## 2024-01-06 MED ORDER — LIDOCAINE HCL (PF) 1 % IJ SOLN
INTRAMUSCULAR | Status: AC
  Administered 2024-01-06: 10 mL
  Filled 2024-01-06: qty 5

## 2024-01-06 MED ORDER — TETANUS-DIPHTH-ACELL PERTUSSIS 5-2.5-18.5 LF-MCG/0.5 IM SUSY
0.5000 mL | PREFILLED_SYRINGE | Freq: Once | INTRAMUSCULAR | Status: AC
  Administered 2024-01-06: 0.5 mL via INTRAMUSCULAR
  Filled 2024-01-06: qty 0.5

## 2024-01-06 MED ORDER — LIDOCAINE HCL (PF) 1 % IJ SOLN
10.0000 mL | Freq: Once | INTRAMUSCULAR | Status: AC
  Filled 2024-01-06: qty 10

## 2024-01-06 NOTE — ED Triage Notes (Signed)
 Pt reports playing picke ball tripped and landed on left arm, pt c/o pain to forearm. Deformity noted.

## 2024-01-06 NOTE — Discharge Instructions (Addendum)
 Your imaging today showed a fracture to one of the bones in your wrist.  You should call Dr. Drusilla office first thing in the morning to schedule an appointment and they will be able to see you later that day.  You should take the prescribed antibiotics until done and return to the emergency room for any fevers, worsening pain to the wrist, or any other concerning symptoms.

## 2024-01-06 NOTE — ED Provider Notes (Signed)
 [facility] Provider Note    Event Date/Time   First MD Initiated Contact with Patient 01/06/24 2105     (approximate)   History   Chief Complaint Arm Injury   HPI  Mariah Meadows is a 73 y.o. female with past medical history of breast cancer who presents to the ED complaining of arm injury.  Patient reports that just prior to arrival she was playing pickle ball when she tripped and fell onto her outstretched left hand.  She felt a pop with immediate pain in her left wrist, noticed obvious deformity at that time.  She also noticed a small abrasion to the lateral portion of the wrist, is not sure whether she scraped her wrist on the court.  She denies any prior injuries to the wrist, has been able to move the fingers and her hand without difficulty.      Physical Exam   Triage Vital Signs: ED Triage Vitals  Encounter Vitals Group     BP 01/06/24 2043 (!) 153/97     Girls Systolic BP Percentile --      Girls Diastolic BP Percentile --      Boys Systolic BP Percentile --      Boys Diastolic BP Percentile --      Pulse Rate 01/06/24 2043 89     Resp 01/06/24 2043 18     Temp 01/06/24 2043 98.7 F (37.1 C)     Temp Source 01/06/24 2043 Oral     SpO2 01/06/24 2043 100 %     Weight 01/06/24 2042 125 lb (56.7 kg)     Height 01/06/24 2042 5' 6 (1.676 m)     Head Circumference --      Peak Flow --      Pain Score 01/06/24 2042 9     Pain Loc --      Pain Education --      Exclude from Growth Chart --     Most recent vital signs: Vitals:   01/06/24 2043 01/06/24 2321  BP: (!) 153/97 (!) 160/85  Pulse: 89 66  Resp: 18 16  Temp: 98.7 F (37.1 C) 98.6 F (37 C)  SpO2: 100% 98%    Constitutional: Alert and oriented. Eyes: Conjunctivae are normal. Head: Atraumatic. Nose: No congestion/rhinnorhea. Mouth/Throat: Mucous membranes are moist.  Cardiovascular: Normal rate, regular rhythm. Grossly normal heart sounds.  2+ radial pulses bilaterally.   Cap refill less than 2 seconds throughout all digits of left hand. Respiratory: Normal respiratory effort.  No retractions. Lungs CTAB. Gastrointestinal: Soft and nontender. No distention. Musculoskeletal: Obvious deformity to the left wrist with dorsal angulation, small wound noted to the lateral portion of the wrist. Neurologic:  Normal speech and language. No gross focal neurologic deficits are appreciated.        ED Results / Procedures / Treatments   Labs (all labs ordered are listed, but only abnormal results are displayed) Labs Reviewed  BASIC METABOLIC PANEL WITH GFR - Abnormal; Notable for the following components:      Result Value   Glucose, Bld 101 (*)    BUN 25 (*)    All other components within normal limits  CBC WITH DIFFERENTIAL/PLATELET    RADIOLOGY Left wrist x-ray reviewed and interpreted by me with distal radius fracture with dorsal angulation.  PROCEDURES:  Critical Care performed: No  .Ortho Injury Treatment  Date/Time: 01/06/2024 11:08 PM  Performed by: Willo Dunnings, MD Authorized by: Willo Dunnings, MD  Consent:    Consent obtained:  Verbal   Consent given by:  PatientInjury location: wrist Location details: left wrist Injury type: fracture Fracture type: distal radius Pre-procedure neurovascular assessment: neurovascularly intact Pre-procedure distal perfusion: normal Pre-procedure neurological function: normal Pre-procedure range of motion: reduced Anesthesia: hematoma block  Anesthesia: Local anesthesia used: yes Local Anesthetic: lidocaine  1% with epinephrine Anesthetic total: 6 mL  Patient sedated: NoManipulation performed: yes Skin traction used: yes Skeletal traction used: yes Reduction successful: yes X-ray confirmed reduction: yes Immobilization: splint and sling Splint type: sugar tong Splint Applied by: ED Provider and Ortho Tech Supplies used: Ortho-Glass, cotton padding and elastic bandage Post-procedure  neurovascular assessment: post-procedure neurovascularly intact Post-procedure distal perfusion: normal Post-procedure neurological function: normal Post-procedure range of motion: unchanged      MEDICATIONS ORDERED IN ED: Medications  oxyCODONE -acetaminophen  (PERCOCET/ROXICET) 5-325 MG per tablet 1 tablet (has no administration in time range)  cephALEXin  (KEFLEX ) capsule 500 mg (has no administration in time range)  morphine  (PF) 4 MG/ML injection 4 mg (4 mg Intravenous Given 01/06/24 2230)  lidocaine  (PF) (XYLOCAINE ) 1 % injection 10 mL (10 mLs Other Given by Other 01/06/24 2247)  Tdap (BOOSTRIX) injection 0.5 mL (0.5 mLs Intramuscular Given 01/06/24 2323)     IMPRESSION / MDM / ASSESSMENT AND PLAN / ED COURSE  I reviewed the triage vital signs and the nursing notes.                              73 y.o. female with past medical history of breast cancer who presents to the ED following trip and fall onto her left wrist with obvious deformity.  Patient's presentation is most consistent with acute presentation with potential threat to life or bodily function.  Differential diagnosis includes, but is not limited to, open fracture, distal radius fracture, wrist dislocation, abrasion.  Patient nontoxic-appearing and in no acute distress, vital signs are unremarkable.  She has obvious deformity to her left wrist with small wound and abrasion to the lateral portion concerning for open fracture.  X-ray shows fracture of the distal radius with dorsal angulation, she is neurovascularly intact distally.  Wound was irrigated with saline, patient's tetanus was updated.  Case discussed with Dr. Onesimo of orthopedics, who recommends reduction and starting patient on Keflex .  He will be able to see her in the office tomorrow to reassess and arrange for surgery.   Hematoma block performed and patient's left wrist was reduced with improvement on follow-up x-ray.  We will discharge her home with  prescription for pain medication and antibiotics, she was counseled to return to the ED for new or worsening symptoms, otherwise follow-up with orthopedics tomorrow.  Patient and spouse agree with plan.      FINAL CLINICAL IMPRESSION(S) / ED DIAGNOSES   Final diagnoses:  Type I or II open Colles' fracture of left radius, initial encounter     Rx / DC Orders   ED Discharge Orders          Ordered    oxyCODONE -acetaminophen  (PERCOCET) 5-325 MG tablet  Every 4 hours PRN        01/06/24 2313    cephALEXin  (KEFLEX ) 500 MG capsule  4 times daily        01/06/24 2313             Note:  This document was prepared using Dragon voice recognition software and may include unintentional dictation errors.   Willo Dunnings,  MD 01/06/24 2325

## 2024-01-06 NOTE — ED Notes (Signed)
 Fingers placed in finger trap by Dr. Willo.  5lb weight hanging on arm.

## 2024-01-06 NOTE — ED Notes (Signed)
 Pt states she was playing pickle ball with her grandson when she fell and caught herself with her arms out.  Pt is noted to have a obvious deformity to the wrist.

## 2024-01-07 ENCOUNTER — Ambulatory Visit: Admitting: Orthopedic Surgery

## 2024-01-07 DIAGNOSIS — S52532A Colles' fracture of left radius, initial encounter for closed fracture: Secondary | ICD-10-CM

## 2024-01-07 NOTE — Progress Notes (Unsigned)
 New Patient Visit  Assessment: Mariah Meadows is a 73 y.o. female with the following: There are no diagnoses linked to this encounter.  Plan: Cadey Bazile Rickett   Follow-up: Return in about 1 week (around 01/14/2024).  Subjective:  Chief Complaint  Patient presents with   Wrist Injury    Wrist injury last night seen in er     History of Present Illness: Mariah Meadows is a 73 y.o. female who {Presentation:27320} for evaluation of    Review of Systems: No fevers or chills*** No numbness or tingling No chest pain No shortness of breath No bowel or bladder dysfunction No GI distress No headaches   Medical History:  Past Medical History:  Diagnosis Date   Breast cancer (HCC) 2002   lt lumpectomy/radiation LEFT   Personal history of radiation therapy    Skin cancer     Past Surgical History:  Procedure Laterality Date   BASAL CELL CARCINOMA EXCISION Left    BREAST BIOPSY Left 2002   lumpectomy/rad   BREAST LUMPECTOMY Left 2002   positive   COLONOSCOPY     COLONOSCOPY WITH PROPOFOL  N/A 05/27/2018   Procedure: COLONOSCOPY WITH PROPOFOL ;  Surgeon: Gaylyn Gladis PENNER, MD;  Location: [facility] ENDOSCOPY;  Service: Endoscopy;  Laterality: N/A;    Family History  Adopted: Yes  Problem Relation Age of Onset   Breast cancer Daughter 54   Social History   Tobacco Use   Smoking status: Former    Current packs/day: 0.30    Types: Cigarettes   Smokeless tobacco: Never  Vaping Use   Vaping status: Never Used  Substance Use Topics   Alcohol use: Yes    Comment: rarely   Drug use: Never    Allergies  Allergen Reactions   Penicillins     No outpatient medications have been marked as taking for the 01/07/24 encounter (Office Visit) with Onesimo Oneil LABOR, MD.    Objective: There were no vitals taken for this visit.  Physical Exam:  General: {General PE Findings:25791} Gait: {Gait:25792}    IMAGING: {XR Reviewed:24899}   New Medications:  No orders of the  defined types were placed in this encounter.     Oneil LABOR Onesimo, MD  01/07/2024 11:48 AM

## 2024-01-08 ENCOUNTER — Encounter: Payer: Self-pay | Admitting: Orthopedic Surgery

## 2024-01-12 ENCOUNTER — Telehealth: Payer: Self-pay

## 2024-01-12 ENCOUNTER — Ambulatory Visit
Admission: RE | Admit: 2024-01-12 | Discharge: 2024-01-12 | Disposition: A | Discharge status: Home / Self Care | Source: Ambulatory Visit | Attending: Orthopedic Surgery

## 2024-01-12 ENCOUNTER — Ambulatory Visit
Admission: RE | Admit: 2024-01-12 | Discharge: 2024-01-12 | Disposition: A | Discharge status: Home / Self Care | Attending: Orthopedic Surgery | Admitting: Orthopedic Surgery

## 2024-01-12 DIAGNOSIS — S52532A Colles' fracture of left radius, initial encounter for closed fracture: Secondary | ICD-10-CM

## 2024-01-12 NOTE — Telephone Encounter (Signed)
 Patient has beeb advised to go to [place] outpatient imaging to have xrays  orders are in

## 2024-01-13 ENCOUNTER — Ambulatory Visit: Admitting: Orthopedic Surgery

## 2024-01-13 DIAGNOSIS — S52532D Colles' fracture of left radius, subsequent encounter for closed fracture with routine healing: Secondary | ICD-10-CM | POA: Diagnosis not present

## 2024-01-14 ENCOUNTER — Encounter: Payer: Self-pay | Admitting: Orthopedic Surgery

## 2024-01-14 NOTE — Progress Notes (Signed)
 Return patient Visit  Assessment: Mariah Meadows is a 73 y.o. female with the following: Left distal radius fracture; in acceptable alignment  Plan: Nielle Duford Denham fell and sustained a left distal radius fracture.  This was reduced in the emergency department.  Repeat radiographs demonstrate stable alignment.  There has been no subsidence.  No appreciable difference in overall alignment.  Will continue with nonoperative management.  Splint was rewrapped in clinic today.  I would like to see her back in 1 week.  At that time, we may be able to transition her to a cast.  Follow-up: Return in about 1 week (around 01/20/2024).  Subjective:  Chief Complaint  Patient presents with   Wrist Injury    Follow up for left wrist      History of Present Illness: Mariah Meadows is a 73 y.o. female who returns for evaluation of left wrist pain.  She injured her left wrist about a week ago.  This was reduced in the emergency department.  She is tolerated her splint.  No numbness or tingling.  She is not taking any pain medicines on a regular basis.  She continues to use a sling.  She has obtained radiographs.  She is here to discuss the findings.  Review of Systems: No fevers or chills No numbness or tingling No chest pain No shortness of breath No bowel or bladder dysfunction No GI distress No headaches      Objective: There were no vitals taken for this visit.  Physical Exam:  General: Alert and oriented. and No acute distress. Gait: Normal gait.  Left arm in a sugar-tong splint, that is clean, dry and intact.  Exposed fingers are warm and well-perfused.  She has intact sensation to her fingers.  She is able to wiggle her fingers.  There is no skin breakdown.  Superficial lacerations in the ulnar side of her wrist are clean, dry and intact.  IMAGING: I personally ordered and reviewed the following images   Repeat radiographs were obtained prior to clinic today.  Distal radius  fracture in stable alignment.  There has been no considerable subsidence.  No further translation.  Joint line is neutral.   New Medications:  No orders of the defined types were placed in this encounter.     Oneil DELENA Horde, MD  01/14/2024 12:21 PM

## 2024-01-19 ENCOUNTER — Telehealth: Payer: Self-pay

## 2024-01-19 ENCOUNTER — Ambulatory Visit
Admission: RE | Admit: 2024-01-19 | Discharge: 2024-01-19 | Disposition: A | Discharge status: Home / Self Care | Attending: Orthopedic Surgery | Admitting: Orthopedic Surgery

## 2024-01-19 ENCOUNTER — Ambulatory Visit
Admission: RE | Admit: 2024-01-19 | Discharge: 2024-01-19 | Disposition: A | Discharge status: Home / Self Care | Source: Ambulatory Visit | Attending: Orthopedic Surgery

## 2024-01-19 ENCOUNTER — Telehealth: Payer: Self-pay | Admitting: Orthopedic Surgery

## 2024-01-19 DIAGNOSIS — S52532D Colles' fracture of left radius, subsequent encounter for closed fracture with routine healing: Secondary | ICD-10-CM

## 2024-01-19 NOTE — Telephone Encounter (Signed)
 Called patient and order is in for [place] to have xrays per patient

## 2024-01-19 NOTE — Telephone Encounter (Signed)
 Pt called stating she has an upcoming appt with Onesimo and want to make sure a order for xrays has been sent to [facility] instead of [place]. Please call pt at [phone].

## 2024-01-20 ENCOUNTER — Ambulatory Visit: Admitting: Orthopedic Surgery

## 2024-01-20 DIAGNOSIS — S52532D Colles' fracture of left radius, subsequent encounter for closed fracture with routine healing: Secondary | ICD-10-CM | POA: Diagnosis not present

## 2024-01-20 NOTE — Progress Notes (Unsigned)
 Return patient Visit  Assessment: Mariah Meadows is a 73 y.o. female with the following: Left distal radius fracture; in acceptable alignment  Plan: Millenia Waldvogel Brenton fell and sustained a left distal radius fracture.  Radiographs has remained stable.  Continue nonoperative management.  She was transitioned to a cast today.  Follow up in 2-3 weeks for repeat evaluation.   Cast application - Left short arm cast   Verbal consent was obtained and the correct extremity was identified. A well padded, appropriately molded short arm cast was applied to the Left arm Fingers remained warm and well perfused.   There were no sharp edges Patient tolerated the procedure well Cast care instructions were provided    Follow-up: Return for 2-3 weeks.  Subjective:  Chief Complaint  Patient presents with   Wrist Injury    Follow up on wrist of the left   having pain and can not get comfortable at night  she is now having pain in the left shoulder     History of Present Illness: Mariah Meadows is a 73 y.o. female who returns for evaluation of left wrist pain.  She injured her left wrist about a week ago.  This was reduced in the emergency department.  She is tolerated her splint.  No numbness or tingling.  She is not taking any pain medicines on a regular basis.  She continues to use a sling.  She has obtained radiographs.  She is here to discuss the findings.  Review of Systems: No fevers or chills No numbness or tingling No chest pain No shortness of breath No bowel or bladder dysfunction No GI distress No headaches      Objective: There were no vitals taken for this visit.  Physical Exam:  General: Alert and oriented. and No acute distress. Gait: Normal gait.  Left arm in a sugar-tong splint, that is clean, dry and intact.  Exposed fingers are warm and well-perfused.  She has intact sensation to her fingers.  She is able to wiggle her fingers.  There is no skin breakdown.  Superficial  lacerations in the ulnar side of her wrist are clean, dry and intact.  IMAGING: I personally ordered and reviewed the following images   Repeat radiographs were obtained prior to clinic today.  Distal radius fracture in stable alignment.  There has been no considerable subsidence.  No further translation.  Joint line is neutral.   New Medications:  No orders of the defined types were placed in this encounter.     Oneil DELENA Horde, MD  01/20/2024 10:31 PM

## 2024-01-20 NOTE — Patient Instructions (Signed)
General Cast Instructions  1.  You were placed in a cast in clinic today.  Please keep the cast material clean, dry and intact.  Please do not use anything to itch the under the cast.  If it gets itchy, you can consider taking benadryl, or similar medication.  If the cast material gets wet, place it on a towel and use a hair dryer on a low setting. 2.  Tylenol or Ibuprofen/Naproxen as needed.   3.  Recommend elevating your extremity as much as possible to help with swelling. 4.  F/u 2-3 weeks, cast off and repeat XR  

## 2024-01-21 ENCOUNTER — Encounter: Payer: Self-pay | Admitting: Orthopedic Surgery

## 2024-02-01 ENCOUNTER — Other Ambulatory Visit: Payer: Self-pay | Admitting: Surgical

## 2024-02-01 ENCOUNTER — Telehealth: Payer: Self-pay | Admitting: Orthopedic Surgery

## 2024-02-01 ENCOUNTER — Ambulatory Visit
Admission: RE | Admit: 2024-02-01 | Discharge: 2024-02-01 | Disposition: A | Discharge status: Home / Self Care | Attending: Surgical | Admitting: Surgical

## 2024-02-01 ENCOUNTER — Ambulatory Visit
Admission: RE | Admit: 2024-02-01 | Discharge: 2024-02-01 | Disposition: A | Discharge status: Home / Self Care | Source: Ambulatory Visit | Attending: Surgical | Admitting: Surgical

## 2024-02-01 DIAGNOSIS — S52532D Colles' fracture of left radius, subsequent encounter for closed fracture with routine healing: Secondary | ICD-10-CM

## 2024-02-01 NOTE — Telephone Encounter (Signed)
 Dr. Onesimo pt - spoke w/Latosha w/[facility] [phone], she stated this pt is there now and they do not have an order.

## 2024-02-01 NOTE — Telephone Encounter (Signed)
 Patient called for her third screening that she needs orders for [facility] for tomorrow appointment at the Imaging in [place]. CB#[phone]

## 2024-02-02 ENCOUNTER — Encounter: Payer: Self-pay | Admitting: Orthopedic Surgery

## 2024-02-02 ENCOUNTER — Ambulatory Visit: Admitting: Orthopedic Surgery

## 2024-02-08 NOTE — Progress Notes (Unsigned)
   Post-Op Visit Note   Patient: Mariah Meadows           Date of Birth: [birth year]           MRN: [id number] Visit Date: 02/02/2024 PCP: Jyl Railing, MD   Assessment & Plan: Exam patient's skin is intact after removal of cast.  She is only minimally tender over the area of the distal radius fracture.  There is still some slight angulation.  She can supinate and pronate with only minimal discomfort at the site of the fracture.  She has limited flexion extension as 1 would expect after this type of fracture.  Otherwise neurovascularly intact. X-rays show continued fracture position with only minimal compression compared to previous films.  Chief Complaint:  Chief Complaint  Patient presents with   Wrist Pain    Followup left wrist fx, DOI 01/06/24, has been in cast, here in followup today.  Xrays done yesterday. Doing ok, has times when it hurst, the whole arm hurts at times.  Taking tylenol  8 hr and that helps.  Cast removed.  Cast was long onto fingers, and very cumersome/uncomfortable for her.    Visit Diagnoses: No diagnosis found.  Status post distal radius fracture of the left wrist following reduction and casting.  Plan: Patient was placed in a more comfortable clamshell type splint to provide additional protection in the next couple of weeks while she continues to heal the fracture.  She is urged to elevate the hand is much as possible and work on gentle range of motion of the fingers.  Dr. Arlinda was consulted regarding the patient's x-rays and feels that they are in an acceptable position for continued healing.  Patient will return to see Dr. Dario in 2 to 3 weeks for follow-up check.  At that point hopefully she will be acceptable for physical therapy.  Follow-Up Instructions: No follow-ups on file.   Orders:  No orders of the defined types were placed in this encounter.  No orders of the defined types were placed in this encounter.   Imaging: No results found.  PMFS  History: There are no active problems to display for this patient.  Past Medical History:  Diagnosis Date   Breast cancer (HCC) 2002   lt lumpectomy/radiation LEFT   Personal history of radiation therapy    Skin cancer     Family History  Adopted: Yes  Problem Relation Age of Onset   Breast cancer Daughter 86    Past Surgical History:  Procedure Laterality Date   BASAL CELL CARCINOMA EXCISION Left    BREAST BIOPSY Left 2002   lumpectomy/rad   BREAST LUMPECTOMY Left 2002   positive   COLONOSCOPY     COLONOSCOPY WITH PROPOFOL  N/A 05/27/2018   Procedure: COLONOSCOPY WITH PROPOFOL ;  Surgeon: Gaylyn Gladis PENNER, MD;  Location: [facility] ENDOSCOPY;  Service: Endoscopy;  Laterality: N/A;   Social History   Occupational History   Not on file  Tobacco Use   Smoking status: Former    Current packs/day: 0.30    Types: Cigarettes   Smokeless tobacco: Never  Vaping Use   Vaping status: Never Used  Substance and Sexual Activity   Alcohol use: Yes    Comment: rarely   Drug use: Never   Sexual activity: Not on file

## 2024-02-12 ENCOUNTER — Telehealth: Payer: Self-pay | Admitting: Orthopedic Surgery

## 2024-02-12 NOTE — Telephone Encounter (Signed)
 Patient called and ask what do she needs to do now? Its been 6 wks and do she needs a f/u appt. CB#[phone]

## 2024-02-17 ENCOUNTER — Telehealth: Payer: Self-pay | Admitting: Orthopedic Surgery

## 2024-02-17 NOTE — Telephone Encounter (Signed)
 Patient called and she is returning your call. Can you please call her back. CB#[phone]

## 2024-02-18 ENCOUNTER — Telehealth: Payer: Self-pay | Admitting: Orthopedic Surgery

## 2024-02-18 ENCOUNTER — Other Ambulatory Visit (INDEPENDENT_AMBULATORY_CARE_PROVIDER_SITE_OTHER): Payer: Self-pay

## 2024-02-18 ENCOUNTER — Ambulatory Visit (INDEPENDENT_AMBULATORY_CARE_PROVIDER_SITE_OTHER): Admitting: Orthopedic Surgery

## 2024-02-18 DIAGNOSIS — S52532D Colles' fracture of left radius, subsequent encounter for closed fracture with routine healing: Secondary | ICD-10-CM | POA: Diagnosis not present

## 2024-02-18 NOTE — Progress Notes (Signed)
 Mariah Meadows - 73 y.o. female MRN [id number]  Date of birth: 09-22-1950  Office Visit Note: Visit Date: 02/18/2024 PCP: Jyl Railing, MD Referred by: Jyl Railing, MD  Subjective: No chief complaint on file.  HPI: Mariah Meadows is a pleasant 73 y.o. female who presents today for evaluation of a left wrist fracture sustained approximately 6 weeks prior.  She is here today for specific hand surgical evaluation given her ongoing concerns.    She has been managed nonoperatively with casting with transition to splint over the past 2 weeks.  Pain is improving, does have some ongoing stiffness.  Pertinent ROS were reviewed with the patient and found to be negative unless otherwise specified above in HPI.   Visit Reason:Left wrist fx Duration of symptoms:52 weeks old Hand dominance: right Occupation:Retired Diabetic: No Smoking: No Heart/Lung History:none Blood Thinners: none  Prior Testing/EMG:xray Injections (Date):none Treatments:splint for two weeks Prior Surgery:none  Assessment & Plan: Visit Diagnoses:  1. Closed Colles' fracture of left radius with routine healing, subsequent encounter     Plan: Repeat x-rays were obtained today of the left wrist fracture.  She is demonstrating appropriate oval healing of the distal radius fracture with moderate residual ulnar positivity.  Fortunately this is relatively asymptomatic on examination today.  She does have some ongoing stiffness which is expected after a wrist fracture of her magnitude.  I did explain that we should transition her today to a removable Exos cast and institute some occupational therapy for range of motion of the wrist and hand given her ongoing stiffness.  We did discuss in detail her x-rays today which do show the residual ulnar positivity.  I explained that should she become symptomatic in the future on the ulnar aspect of the wrist, we could always discuss the possibility of ulnar shortening osteotomy  as needed.  She expressed full understanding.  Removable Exos cast short arm wrist was applied today and well-fitting.  Occupational Therapy referral was placed.  She is welcome to return to me in approximately 4 to 6 weeks for repeat clinical and radiographic check.  If she would prefer to follow-up in the [place] area this is appropriate as well.  I spent 45 minutes in the care of this patient today including review of previous documentation, imaging obtained, face-to-face time discussing all options regarding treatment and documenting the encounter.   Follow-up: No follow-ups on file.   Meds & Orders: No orders of the defined types were placed in this encounter.   Orders Placed This Encounter  Procedures   XR Wrist Complete Left   Ambulatory referral to Occupational Therapy     Procedures: No procedures performed      Clinical History: No specialty comments available.  She reports that she has quit smoking. Her smoking use included cigarettes. She has never used smokeless tobacco. No results for input(s): HGBA1C, LABURIC in the last 8760 hours.  Objective:   Vital Signs: There were no vitals taken for this visit.  Physical Exam  Gen: Well-appearing, in no acute distress; non-toxic CV: Regular Rate. Well-perfused. Warm.  Resp: Breathing unlabored on room air; no wheezing. Psych: Fluid speech in conversation; appropriate affect; normal thought process  Ortho Exam Left wrist: - Moderate tenderness to deep palpation at the distal radius and distal ulna region - Wrist range of motion flexion/extension 45/35 - Near composite fist passively - Rotation pronation/supination 65/60, no significant DRUJ instability with gentle stress testing - Minimal pain with ulnar deviation, no significant  foveal tenderness  Imaging: XR Wrist Complete Left Result Date: 02/18/2024 X-rays of the left wrist demonstrate interval healing of the previously known distal radius fracture.  Volar  tilt is well-maintained in the lateral view.  There is evidence of residual ulnar positivity with distal radius shortening.   Past Medical/Family/Surgical/Social History: Medications & Allergies reviewed per EMR, new medications updated. There are no active problems to display for this patient.  Past Medical History:  Diagnosis Date   Breast cancer (HCC) 2002   lt lumpectomy/radiation LEFT   Personal history of radiation therapy    Skin cancer    Family History  Adopted: Yes  Problem Relation Age of Onset   Breast cancer Daughter 27   Past Surgical History:  Procedure Laterality Date   BASAL CELL CARCINOMA EXCISION Left    BREAST BIOPSY Left 2002   lumpectomy/rad   BREAST LUMPECTOMY Left 2002   positive   COLONOSCOPY     COLONOSCOPY WITH PROPOFOL  N/A 05/27/2018   Procedure: COLONOSCOPY WITH PROPOFOL ;  Surgeon: Gaylyn Gladis PENNER, MD;  Location: [facility] ENDOSCOPY;  Service: Endoscopy;  Laterality: N/A;   Social History   Occupational History   Not on file  Tobacco Use   Smoking status: Former    Current packs/day: 0.30    Types: Cigarettes   Smokeless tobacco: Never  Vaping Use   Vaping status: Never Used  Substance and Sexual Activity   Alcohol use: Yes    Comment: rarely   Drug use: Never   Sexual activity: Not on file    Nethan Caudillo Afton Alderton, M.D.  [facility], Hand Surgery

## 2024-02-24 NOTE — Therapy (Signed)
 OUTPATIENT OCCUPATIONAL THERAPY ORTHO EVALUATION  Patient Name: Mariah Meadows MRN: [id number] DOB:09-07-1950, 73 y.o., female Today's Date: 02/26/2024  PCP: Jyl FURY MD REFERRING PROVIDER:  Arlinda Buster, MD    END OF SESSION:  OT End of Session - 02/26/24 1102     Visit Number 1    Number of Visits 10    Date for OT Re-Evaluation 04/08/24    Authorization Type BCBS    OT Start Time 1102    OT Stop Time 1153    OT Time Calculation (min) 51 min    Equipment Utilized During Treatment Compressive sleeve    Activity Tolerance Patient tolerated treatment well;No increased pain;Patient limited by fatigue;Patient limited by pain    Behavior During Therapy [facility] for tasks assessed/performed          Past Medical History:  Diagnosis Date   Breast cancer (HCC) 2002   lt lumpectomy/radiation LEFT   Personal history of radiation therapy    Skin cancer    Past Surgical History:  Procedure Laterality Date   BASAL CELL CARCINOMA EXCISION Left    BREAST BIOPSY Left 2002   lumpectomy/rad   BREAST LUMPECTOMY Left 2002   positive   COLONOSCOPY     COLONOSCOPY WITH PROPOFOL  N/A 05/27/2018   Procedure: COLONOSCOPY WITH PROPOFOL ;  Surgeon: Gaylyn Gladis PENNER, MD;  Location: [facility] ENDOSCOPY;  Service: Endoscopy;  Laterality: N/A;   There are no active problems to display for this patient.   ONSET DATE: 01/06/24 DOI  REFERRING DIAG: D47.467I (ICD-10-CM) - Closed Colles' fracture of left radius with routine healing, subsequent encounter   THERAPY DIAG:  Stiffness of left wrist, not elsewhere classified - Plan: Ot plan of care cert/re-cert  Muscle weakness (generalized) - Plan: Ot plan of care cert/re-cert  Localized edema - Plan: Ot plan of care cert/re-cert  Other lack of coordination - Plan: Ot plan of care cert/re-cert  Pain in left wrist - Plan: Ot plan of care cert/re-cert  Rationale for Evaluation and Treatment: Rehabilitation  SUBJECTIVE:   SUBJECTIVE  STATEMENT: Now 7 weeks s/p L DRF, conservative healing..  She is not wearing any brace when she enters the room, states that she has some soreness and weakness as well as stiffness and difficulty lifting weight.  She obviously has decreased awareness and understanding of conservative healing times, and desperately needs help and education..  She should not be doing weightbearing right now, and she should be cautious in wearing her brace to support her healing wrist and this was explained to her.  Additionally, she states that her husband just had a hip surgery and she has been trying to help him a lot at home which has been difficult and painful.     PERTINENT HISTORY:  non op left wrist fx   PRECAUTIONS: None  RED FLAGS: None   WEIGHT BEARING RESTRICTIONS: Yes no weight more than 1-2# for next 3 weeks  PAIN:  Are you having pain? Yes: NPRS scale: 1/10 Pain location: Lt FA/wrist Pain description: intermittent zaps of nerve pain Aggravating factors: weight bearing  Relieving factors: rest, heat  FALLS: Has patient fallen in last 6 months? Yes. Number of falls 1 (this accident- not considered a fall risk)    PLOF: Independent  PATIENT GOALS: To improve stiffness, weakness, pain in left wrist and arm  NEXT MD VISIT: She has not scheduled follow-up yet but was recommended to see him next week at 8 weeks postinjury    OBJECTIVE: (All objective assessments  below are from initial evaluation on: 02/26/24 unless otherwise specified.)   HAND DOMINANCE: Right   ADLs: Overall ADLs: States decreased ability to grab, hold household objects, pain and difficulty to open containers, perform FMS tasks (manipulate fasteners on clothing), mild to moderate bathing problems as well.    FUNCTIONAL OUTCOME MEASURES: Eval: Patient Specific Functional Scale: 1.3 (can opener, making a fist, holding 2#+)  (Higher Score  =  Better Ability for the Selected Tasks)       UPPER EXTREMITY ROM      Shoulder to Wrist AROM Left eval  Forearm supination 64  Forearm pronation  79  Wrist flexion 33  Wrist extension 46  Functional dart thrower's motion (F-DTM) in ulnar flexion 18  F-DTM in radial extension  45  (Blank rows = not tested)   Hand AROM Left eval  Full Fist Ability (or Gap to Distal Palmar Crease) Very loose full fist, esp loose in IF  Thumb Opposition  (Kapandji Scale)  6/10  (Blank rows = not tested)   UPPER EXTREMITY MMT:    Eval:  NT at eval due to recent and still healing injuries. Will be tested when appropriate.   MMT Left TBD  Shoulder flexion   Shoulder abduction   Shoulder adduction   Shoulder extension   Shoulder internal rotation   Shoulder external rotation   Middle trapezius   Lower trapezius   Elbow flexion   Elbow extension   Forearm supination   Forearm pronation   Wrist flexion   Wrist extension   Wrist ulnar deviation   Wrist radial deviation   (Blank rows = not tested)  HAND FUNCTION: Eval: Observed weakness in affected Lt hand.  TBD when safe Grip strength Right: TBD lbs, Left: TBD lbs   COORDINATION: Eval: Observed coordination impairments with affected Lt hand.  9 Hole Peg Test Left:  29sec (27 sec is WFL; 22 is AVG)   SENSATION: Eval:  Light touch intact today  EDEMA:   Eval:  Mildly swollen in Lt hand and wrist today, 17.2cm circumferentially around left distal wrist crease compared to 15.2cm in Rt distal wrist crease   COGNITION: Eval: Overall cognitive status: WFL for evaluation today   OBSERVATIONS:   Eval: She has some typical swelling and soreness weakness and stiffness in the left hand wrist and arm now.   TODAY'S TREATMENT:  Post-evaluation treatment:   For safety/self-care she was educated on no significant weightbearing, not to cause herself any pain, to do retrograde massages to help control edema, and that it is good and beneficial to keep her fingers and thumb moving to perform light functional  activities that way under 1 or 2 pounds.   Custom orthotic fabrication was indicated due to pt's healing left distal radius fracture and need for safe, functional positioning. OT fabricated custom wrist immobilization orthosis for pt today to protect the fracture site. It fit well with no areas of pressure, pt states a comfortable fit. Pt was educated on the wearing schedule (on at all times except for hygiene and exercises), to avoid exposing it to sources of heat, to wipe clean as needed (do not wash, use harsh detergents), to call or come in ASAP if it is causing any irritation or is not achieving desired function. It will be checked/adjusted in upcoming sessions, as needed. Pt states understanding all directions.     OT provides the following home exercise program to perform 4-6 times a day as tolerated, to be done without pain,  and she demonstrates back for understanding and pain-free performance.  Exercises - Turn [employer] Facing Up & Down  - 4-6 x daily - 10-15 reps - Bend and Pull Back Wrist SLOWLY  - 4 x daily - 10-15 reps - Wrist AROM Dart Throwers Motion  - 4 x daily - 10-15 reps - Finger Spreading  - 4-6 x daily - 10-15 reps - Seated Thumb Circumduction AROM  - 4-6 x daily - 10-15 reps - Thumb Opposition  - 4-6 x daily - 10 reps - Tendon Glides  - 4-6 x daily - 3-5 reps - 2-3 seconds hold   PATIENT EDUCATION: Education details: See tx section above for details  Person educated: Patient Education method: Verbal Instruction, Teach back, Handouts  Education comprehension: States and demonstrates understanding, Additional Education required    HOME EXERCISE PROGRAM: Access Code: [email or web address] URL: [email or web address] Date: 02/26/2024 Prepared by: Melvenia Ada    GOALS: Goals reviewed with patient? Yes   SHORT TERM GOALS: (STG required if POC>30 days) Target Date: 03/11/24  Pt will obtain protective, custom orthotic. Goal status: TBD/PRN  2.  Pt will  demo/state understanding of initial HEP to improve pain levels and prerequisite motion. Goal status: INITIAL   LONG TERM GOALS: Target Date: 04/08/24  Pt will improve functional ability by decreased impairment per PSFS assessment from 1.3 to 7 or better, for better quality of life. Goal status: INITIAL  2.  Pt will improve grip strength in left hand from unsafe to test to at least 25 lbs for functional use at home and in IADLs. Goal status: INITIAL  3.  Pt will improve A/ROM in left wrist flexion/extension from 33/46 degrees respectively to at least 55 degrees each, to have functional motion for tasks like reach and grasp.  Goal status: INITIAL  4.  Pt will improve strength in left wrist flexion/extension from apparent 3 -/5 MMT to at least 4+/5 MMT to have increased functional ability to carry out selfcare and higher-level homecare tasks with less difficulty. Goal status: INITIAL  5.  Pt will improve coordination skills in left hand and arm, as seen by within functional limit score on nine-hole peg testing to have increased functional ability to carry out fine motor tasks (fasteners, etc.) and more complex, coordinated IADLs (meal prep, sports, etc.).  Goal status: INITIAL    ASSESSMENT:  CLINICAL IMPRESSION: Patient is a 73 y.o. female who was seen today for occupational therapy evaluation for stiffness, weakness, swelling, decreased strength and functional ability in the left hand and arm after distal radius fracture with conservative healing.  The patient will benefit from outpatient occupational therapy to decrease symptoms, improve functional upper extremity use, and increase quality of life.  PERFORMANCE DEFICITS: in functional skills including ADLs, IADLs, coordination, dexterity, edema, ROM, strength, pain, fascial restrictions, Gross motor control, body mechanics, endurance, decreased knowledge of precautions, and UE functional use, cognitive skills including problem solving  and safety awareness, and psychosocial skills including coping strategies, environmental adaptation, habits, and routines and behaviors.   IMPAIRMENTS: are limiting patient from ADLs, IADLs, rest and sleep, and leisure.   COMORBIDITIES: may have co-morbidities  that affects occupational performance. Patient will benefit from skilled OT to address above impairments and improve overall function.  MODIFICATION OR ASSISTANCE TO COMPLETE EVALUATION: No modification of tasks or assist necessary to complete an evaluation.  OT OCCUPATIONAL PROFILE AND HISTORY: Detailed assessment: Review of records and additional review of physical, cognitive, psychosocial history related to current functional performance.  CLINICAL DECISION  MAKING: LOW - limited treatment options, no task modification necessary  REHAB POTENTIAL: Excellent  EVALUATION COMPLEXITY: Low      PLAN:  OT FREQUENCY: 1-2x/week  OT DURATION: 6 weeks through 04/08/24 and up to 10 total visits as needed   PLANNED INTERVENTIONS: 97535 self care/ADL training, 02889 therapeutic exercise, 97530 therapeutic activity, 97112 neuromuscular re-education, 97140 manual therapy, 97035 ultrasound, 97032 electrical stimulation (manual), 97760 Orthotic Initial, [id number] Orthotic/Prosthetic subsequent, compression bandaging, Dry needling, energy conservation, coping strategies training, and patient/family education  RECOMMENDED OTHER SERVICES: none now    CONSULTED AND AGREED WITH PLAN OF CARE: Patient  PLAN FOR NEXT SESSION:   Review initial HEP and recommendations, upgrade to light stretches in next session. After 8 weeks post fx introduce hand strength, at 9 weeks post fx also ok to introduce light FA,wrist strength as well,as tolerated.  She states that she is from [place] and would like to see Deland Bernheim and was recommended her as she is a good therapist who works with the upper extremity.  She was given the number and address and told to  call immediately to get in as soon as possible.   Melvenia Ada, OTR/L, CHT  02/26/2024, 12:06 PM

## 2024-02-26 ENCOUNTER — Ambulatory Visit: Admitting: Rehabilitative and Restorative Service Providers"

## 2024-02-26 ENCOUNTER — Telehealth: Payer: Self-pay | Admitting: Orthopedic Surgery

## 2024-02-26 ENCOUNTER — Encounter: Payer: Self-pay | Admitting: Rehabilitative and Restorative Service Providers"

## 2024-02-26 DIAGNOSIS — M25632 Stiffness of left wrist, not elsewhere classified: Secondary | ICD-10-CM

## 2024-02-26 DIAGNOSIS — M6281 Muscle weakness (generalized): Secondary | ICD-10-CM | POA: Diagnosis not present

## 2024-02-26 DIAGNOSIS — R278 Other lack of coordination: Secondary | ICD-10-CM

## 2024-02-26 DIAGNOSIS — M25532 Pain in left wrist: Secondary | ICD-10-CM

## 2024-02-26 DIAGNOSIS — R600 Localized edema: Secondary | ICD-10-CM

## 2024-02-26 NOTE — Telephone Encounter (Signed)
 LMOM to have pt cb to schedule appointment week of 10/6

## 2024-02-26 NOTE — Telephone Encounter (Signed)
 Patient came in to see Nate today and needs to schedule with Ans. CB#[phone]

## 2024-03-03 ENCOUNTER — Ambulatory Visit: Attending: Orthopedic Surgery | Admitting: Occupational Therapy

## 2024-03-03 ENCOUNTER — Encounter: Payer: Self-pay | Admitting: Orthopedic Surgery

## 2024-03-03 ENCOUNTER — Encounter: Payer: Self-pay | Admitting: Occupational Therapy

## 2024-03-03 DIAGNOSIS — M6281 Muscle weakness (generalized): Secondary | ICD-10-CM | POA: Diagnosis present

## 2024-03-03 DIAGNOSIS — M25632 Stiffness of left wrist, not elsewhere classified: Secondary | ICD-10-CM | POA: Diagnosis present

## 2024-03-03 DIAGNOSIS — R278 Other lack of coordination: Secondary | ICD-10-CM | POA: Diagnosis present

## 2024-03-03 DIAGNOSIS — M25532 Pain in left wrist: Secondary | ICD-10-CM | POA: Diagnosis present

## 2024-03-03 DIAGNOSIS — R600 Localized edema: Secondary | ICD-10-CM | POA: Insufficient documentation

## 2024-03-03 NOTE — Therapy (Signed)
 OUTPATIENT OCCUPATIONAL THERAPY ORTHO EVALUATION  Patient Name: Mariah Meadows MRN: [id number] DOB:May 11, 1951, 73 y.o., female Today's Date: 03/03/2024  PCP: Jyl FURY MD REFERRING PROVIDER:  Arlinda Buster, MD    END OF SESSION:  OT End of Session - 03/03/24 1538     Visit Number 2    Number of Visits 10    Date for Recertification  04/08/24    Authorization Type BCBS    OT Start Time 1533    OT Stop Time 1615    OT Time Calculation (min) 42 min    Equipment Utilized During Treatment Compressive sleeve    Activity Tolerance Patient tolerated treatment well;No increased pain;Patient limited by fatigue;Patient limited by pain    Behavior During Therapy [facility] for tasks assessed/performed          Past Medical History:  Diagnosis Date   Breast cancer [facility]) 2002   lt lumpectomy/radiation LEFT   Personal history of radiation therapy    Skin cancer    Past Surgical History:  Procedure Laterality Date   BASAL CELL CARCINOMA EXCISION Left    BREAST BIOPSY Left 2002   lumpectomy/rad   BREAST LUMPECTOMY Left 2002   positive   COLONOSCOPY     COLONOSCOPY WITH PROPOFOL  N/A 05/27/2018   Procedure: COLONOSCOPY WITH PROPOFOL ;  Surgeon: Gaylyn Gladis PENNER, MD;  Location[facility] ENDOSCOPY;  Service: Endoscopy;  Laterality: N/A;   There are no active problems to display for this patient.   ONSET DATE: 01/06/24 DOI  REFERRING DIAG: D47.467I (ICD-10-CM) - Closed Colles' fracture of left radius with routine healing, subsequent encounter   THERAPY DIAG:  Other lack of coordination  Muscle weakness (generalized)  Pain in left wrist  Rationale for Evaluation and Treatment: Rehabilitation  SUBJECTIVE:   SUBJECTIVE STATEMENT: Pt reports pain/difficulty assisting spouse [place] with compression socks following his recent hip replacement.    PERTINENT HISTORY:  non op left wrist fx   PRECAUTIONS: None  RED FLAGS: None   WEIGHT BEARING RESTRICTIONS: Yes no weight more  than 1-2# for next 3 weeks  PAIN:  Are you having pain? Yes: NPRS scale: 1/10 Pain location: Lt FA/wrist Pain description: intermittent zaps of nerve pain Aggravating factors: weight bearing  Relieving factors: rest, heat  FALLS: Has patient fallen in last 6 months? Yes. Number of falls 1 (this accident- not considered a fall risk)    PLOF: Independent  PATIENT GOALS: To improve stiffness, weakness, pain in left wrist and arm  NEXT MD VISIT: 10/8 follow up appnt    OBJECTIVE: (All objective assessments below are from initial evaluation on: 02/26/24 unless otherwise specified.)   HAND DOMINANCE: Right   ADLs: Overall ADLs: States decreased ability to grab, hold household objects, pain and difficulty to open containers, perform FMS tasks (manipulate fasteners on clothing), mild to moderate bathing problems as well.    FUNCTIONAL OUTCOME MEASURES: Eval: Patient Specific Functional Scale: 1.3 (can opener, making a fist, holding 2#+)  (Higher Score  =  Better Ability for the Selected Tasks)       UPPER EXTREMITY ROM     Shoulder to Wrist AROM Left eval  Forearm supination 64  Forearm pronation  79  Wrist flexion 33  Wrist extension 46  Functional dart thrower's motion (F-DTM) in ulnar flexion 18  F-DTM in radial extension  45  (Blank rows = not tested)   Hand AROM Left eval  Full Fist Ability (or Gap to Distal Palmar Crease) Very loose full fist, esp loose  in IF  Thumb Opposition  (Kapandji Scale)  6/10  (Blank rows = not tested)   UPPER EXTREMITY MMT:    Eval:  NT at eval due to recent and still healing injuries. Will be tested when appropriate.   MMT Left TBD  Shoulder flexion   Shoulder abduction   Shoulder adduction   Shoulder extension   Shoulder internal rotation   Shoulder external rotation   Middle trapezius   Lower trapezius   Elbow flexion   Elbow extension   Forearm supination   Forearm pronation   Wrist flexion   Wrist extension   Wrist  ulnar deviation   Wrist radial deviation   (Blank rows = not tested)  HAND FUNCTION: Eval: Observed weakness in affected Lt hand.  TBD when safe Grip strength Right: TBD lbs, Left: TBD lbs   COORDINATION: Eval: Observed coordination impairments with affected Lt hand.  9 Hole Peg Test Left:  29sec (27 sec is WFL; 22 is AVG)   SENSATION: Eval:  Light touch intact today  EDEMA:   Eval:  Mildly swollen in Lt hand and wrist today, 17.2cm circumferentially around left distal wrist crease compared to 15.2cm in Rt distal wrist crease   COGNITION: Eval: Overall cognitive status: WFL for evaluation today   OBSERVATIONS:   Eval: She has some typical swelling and soreness weakness and stiffness in the left hand wrist and arm now.   TODAY'S TREATMENT:  03/03/24   Brace fit assessed with no abrasions noted and pt denying pain with brace. Educated on splint wearing schedule - reports only wearing at night and walking dog. Encouraged to wear with activities to prevent overuse. Educated on modifications for spouse compression stocking mgmt - plan to call Dr Hooten to determine when safe to discontinue wearing as pt is 8 days post op.   Compression glove provided for edema mgmt - encouraged to wear at rest. Reviewed retrograde massage with good recall. Soft tissue massage to palm and distal forearm for edema mgmt. Contrast bath performed 8 min for edema mgmt - educated on completion at home.   Reviewed HEP with fair recall. Cues t/o for hand placement on arm rest or table to minimize shoulder hiking. Reports completing HEP 3-4x daily with 2/10 pain.    PATIENT EDUCATION: Education details: See tx section above for details  Person educated: Patient Education method: Verbal Instruction, Teach back, Handouts  Education comprehension: States and demonstrates understanding, Additional Education required    HOME EXERCISE PROGRAM: Access Code: [email or web address] URL: [email or web address] Date:  02/26/2024 Prepared by: Melvenia Ada    GOALS: Goals reviewed with patient? Yes   SHORT TERM GOALS: (STG required if POC>30 days) Target Date: 03/11/24  Pt will obtain protective, custom orthotic. Goal status: TBD/PRN  2.  Pt will demo/state understanding of initial HEP to improve pain levels and prerequisite motion. Goal status: INITIAL   LONG TERM GOALS: Target Date: 04/08/24  Pt will improve functional ability by decreased impairment per PSFS assessment from 1.3 to 7 or better, for better quality of life. Goal status: INITIAL  2.  Pt will improve grip strength in left hand from unsafe to test to at least 25 lbs for functional use at home and in IADLs. Goal status: INITIAL  3.  Pt will improve A/ROM in left wrist flexion/extension from 33/46 degrees respectively to at least 55 degrees each, to have functional motion for tasks like reach and grasp.  Goal status: INITIAL  4.  Pt will improve strength in left  wrist flexion/extension from apparent 3 -/5 MMT to at least 4+/5 MMT to have increased functional ability to carry out selfcare and higher-level homecare tasks with less difficulty. Goal status: INITIAL  5.  Pt will improve coordination skills in left hand and arm, as seen by within functional limit score on nine-hole peg testing to have increased functional ability to carry out fine motor tasks (fasteners, etc.) and more complex, coordinated IADLs (meal prep, sports, etc.).  Goal status: INITIAL    ASSESSMENT:  CLINICAL IMPRESSION: Patient is a 73 y.o. female who was seen today for occupational therapy evaluation for stiffness, weakness, swelling, decreased strength and functional ability in the left hand and arm after distal radius fracture with conservative healing.   Patient presents wearing custom splint, reports 1/10 pain at rest, 2-3/10 pain with tendon glides. Issued compression glove and reviewed wearing schedule - splint with activity, compression glove at  rest for edema mgmt. Improved thumb opposition to 5th digit DIP.  Reviewed HEP with cues for hand/shoulder placement and full pain free AROM. The patient will benefit from outpatient occupational therapy to decrease symptoms, improve functional upper extremity use, and increase quality of life.  PERFORMANCE DEFICITS: in functional skills including ADLs, IADLs, coordination, dexterity, edema, ROM, strength, pain, fascial restrictions, Gross motor control, body mechanics, endurance, decreased knowledge of precautions, and UE functional use, cognitive skills including problem solving and safety awareness, and psychosocial skills including coping strategies, environmental adaptation, habits, and routines and behaviors.   IMPAIRMENTS: are limiting patient from ADLs, IADLs, rest and sleep, and leisure.   COMORBIDITIES: may have co-morbidities  that affects occupational performance. Patient will benefit from skilled OT to address above impairments and improve overall function.  MODIFICATION OR ASSISTANCE TO COMPLETE EVALUATION: No modification of tasks or assist necessary to complete an evaluation.  OT OCCUPATIONAL PROFILE AND HISTORY: Detailed assessment: Review of records and additional review of physical, cognitive, psychosocial history related to current functional performance.  CLINICAL DECISION MAKING: LOW - limited treatment options, no task modification necessary  REHAB POTENTIAL: Excellent  EVALUATION COMPLEXITY: Low      PLAN:  OT FREQUENCY: 1-2x/week  OT DURATION: 6 weeks through 04/08/24 and up to 10 total visits as needed   PLANNED INTERVENTIONS: 97535 self care/ADL training, 02889 therapeutic exercise, 97530 therapeutic activity, 97112 neuromuscular re-education, 97140 manual therapy, 97035 ultrasound, 97032 electrical stimulation (manual), 97760 Orthotic Initial, [id number] Orthotic/Prosthetic subsequent, compression bandaging, Dry needling, energy conservation, coping strategies  training, and patient/family education  RECOMMENDED OTHER SERVICES: none now    CONSULTED AND AGREED WITH PLAN OF CARE: Patient  PLAN FOR NEXT SESSION:   Review HEP and recommendations, upgrade to light stretches in next session. After 8 weeks post fx introduce hand strength, at 9 weeks post fx also ok to introduce light FA,wrist strength as well,as tolerated.   Elston Slot, M.S. OTR/L  03/03/24, 3:39 PM  ascom [phone]  03/03/2024, 3:39 PM

## 2024-03-07 ENCOUNTER — Ambulatory Visit: Admitting: Occupational Therapy

## 2024-03-07 DIAGNOSIS — M25632 Stiffness of left wrist, not elsewhere classified: Secondary | ICD-10-CM

## 2024-03-07 DIAGNOSIS — R278 Other lack of coordination: Secondary | ICD-10-CM | POA: Diagnosis not present

## 2024-03-07 DIAGNOSIS — M25532 Pain in left wrist: Secondary | ICD-10-CM

## 2024-03-07 DIAGNOSIS — R600 Localized edema: Secondary | ICD-10-CM

## 2024-03-07 DIAGNOSIS — M6281 Muscle weakness (generalized): Secondary | ICD-10-CM

## 2024-03-07 NOTE — Therapy (Signed)
 OUTPATIENT OCCUPATIONAL THERAPY ORTHO  TREATMEnt  Patient Name: Mariah Meadows MRN: [id number] DOB:1950/12/03, 73 y.o., female Today's Date: 03/07/2024  PCP: Jyl FURY MD REFERRING PROVIDER:  Arlinda Buster, MD    END OF SESSION:  OT End of Session - 03/07/24 1116     Visit Number 3    Number of Visits 10    Date for Recertification  04/08/24    OT Start Time 1115    OT Stop Time 1210    OT Time Calculation (min) 55 min    Activity Tolerance Patient tolerated treatment well;No increased pain;Patient limited by fatigue;Patient limited by pain    Behavior During Therapy [facility] for tasks assessed/performed          Past Medical History:  Diagnosis Date   Breast cancer [facility]) 2002   lt lumpectomy/radiation LEFT   Personal history of radiation therapy    Skin cancer    Past Surgical History:  Procedure Laterality Date   BASAL CELL CARCINOMA EXCISION Left    BREAST BIOPSY Left 2002   lumpectomy/rad   BREAST LUMPECTOMY Left 2002   positive   COLONOSCOPY     COLONOSCOPY WITH PROPOFOL  N/A 05/27/2018   Procedure: COLONOSCOPY WITH PROPOFOL ;  Surgeon: Gaylyn Gladis PENNER, MD;  Location: [facility] ENDOSCOPY;  Service: Endoscopy;  Laterality: N/A;   There are no active problems to display for this patient.   ONSET DATE: 01/06/24 DOI  REFERRING DIAG: D47.467I (ICD-10-CM) - Closed Colles' fracture of left radius with routine healing, subsequent encounter   THERAPY DIAG:  Muscle weakness (generalized)  Pain in left wrist  Localized edema  Stiffness of left wrist, not elsewhere classified  Other lack of coordination  Rationale for Evaluation and Treatment: Rehabilitation  SUBJECTIVE:   SUBJECTIVE STATEMENT: I just get hard on myself at times - because my hand and arm is just not getting better- trying to use it more but the splint , and then it is heavy and  compensate with my shoulder and everything is hard- my husband at least don't need to compression socks  anymore   PERTINENT HISTORY:  non op left wrist fx   PRECAUTIONS: None  RED FLAGS: None   WEIGHT BEARING RESTRICTIONS: Yes no weight more than 1-2# for next 3 weeks  PAIN:  Are you having pain? 2/10 ulnar wrist   FALLS: Has patient fallen in last 6 months? Yes. Number of falls 1 (this accident- not considered a fall risk)    PLOF: Independent  PATIENT GOALS: To improve stiffness, weakness, pain in left wrist and arm  NEXT MD VISIT: 10/8 follow up appnt    OBJECTIVE: (All objective assessments below are from initial evaluation on: 02/26/24 unless otherwise specified.)   HAND DOMINANCE: Right   ADLs: Overall ADLs: States decreased ability to grab, hold household objects, pain and difficulty to open containers, perform FMS tasks (manipulate fasteners on clothing), mild to moderate bathing problems as well.    FUNCTIONAL OUTCOME MEASURES: Eval: Patient Specific Functional Scale: 1.3 (can opener, making a fist, holding 2#+)  (Higher Score  =  Better Ability for the Selected Tasks)       UPPER EXTREMITY ROM     Shoulder to Wrist AROM Left eval  L 03/07/24  Forearm supination 64 75  Forearm pronation  79 90  Wrist flexion 33 45  Wrist extension 46 62  Functional dart thrower's motion (F-DTM) in ulnar flexion 18   F-DTM in radial extension  45   (Blank rows =  not tested)  RD 30 UD 12  Hand AROM Left eval  Full Fist Ability (or Gap to Distal Palmar Crease) Very loose full fist, esp loose in IF  Thumb Opposition  (Kapandji Scale)  6/10  (Blank rows = not tested) Opposition to distal fold of 5th - 03/07/24  UPPER EXTREMITY MMT:    Eval:  NT at eval due to recent and still healing injuries. Will be tested when appropriate.   MMT Left TBD  Shoulder flexion   Shoulder abduction   Shoulder adduction   Shoulder extension   Shoulder internal rotation   Shoulder external rotation   Middle trapezius   Lower trapezius   Elbow flexion   Elbow extension   Forearm  supination   Forearm pronation   Wrist flexion   Wrist extension   Wrist ulnar deviation   Wrist radial deviation   (Blank rows = not tested)  HAND FUNCTION: Eval: Observed weakness in affected Lt hand.   03/07/24 Grip strength R 60; L 19 lbs, Lat grip R 13; L 10lbs , 3 point pinch R 13 , L 8 lbs  COORDINATION: Eval: Observed coordination impairments with affected Lt hand.  9 Hole Peg Test Left:  29sec (27 sec is WFL; 22 is AVG)   SENSATION: Eval:  Light touch intact today  EDEMA:   Eval:  Mildly swollen in Lt hand and wrist today, 17.2cm circumferentially around left distal wrist crease compared to 15.2cm in Rt distal wrist crease   COGNITION: Eval: Overall cognitive status: WFL for evaluation today   OBSERVATIONS:   Eval: She has some typical swelling and soreness weakness and stiffness in the left hand wrist and arm now.   TODAY'S TREATMENT:  03/07/24   Patient arrived with wrist brace in place.  Report wearing it mostly with activities at home. Husband that had hip replacement surgery does not need compression stockings anymore. Reports the brace just feels heavy and she compensates with her elbow and shoulder. In for so hard to do things. Denies pain only with supination at ulnar wrist.  Compression glove provided last visit for edema mgmt - encouraged to wear as needed during the day but at nighttime under her brace. Encourage patient to do contrast at home prior to home exercises to decrease edema and decrease stiffness  Fluidotherapy done for 8 minutes to increase active range of motion for wrist in all planes.   soft tissue massage and mobilization to metacarpals as well as wrist and volar and dorsal forearm.  Done soft tissue mobilization in combination with ulnar radial deviation as well as wrist flexion extension.  Reviewed HEP for supination pronation.  Patient in min verbal cueing to keep elbow to the side and not compensate during supination. Change wrist  flexion extension over the armrest to active assistive range tolerated well 12 reps hold 5 seconds Able to add 16 ounce hammer for supination pronation with a Benik neoprene wrap on wrist.  12 reps 2 times a day As well as ulnar radial deviation 16 ounce hammer to the side 12 reps.  Patient can start wearing Benik neoprene wrist wrap with light activities like changing a pillowcase folding dries laundry cooking light activities.  Was pain-free.  Continue with tendon glides as well as thumb palmar abduction and opposition distal fold of   PATIENT EDUCATION: Education details: See tx section above for details  Person educated: Patient Education method: Verbal Instruction, Teach back, Handouts  Education comprehension: States and demonstrates understanding, Additional Education  required       GOALS: Goals reviewed with patient? Yes   SHORT TERM GOALS: (STG required if POC>30 days) Target Date: 03/11/24  Pt will obtain protective, custom orthotic. Goal status: TBD/PRN  2.  Pt will demo/state understanding of initial HEP to improve pain levels and prerequisite motion. Goal status: INITIAL   LONG TERM GOALS: Target Date: 04/08/24  Pt will improve functional ability by decreased impairment per PSFS assessment from 1.3 to 7 or better, for better quality of life. Goal status: INITIAL  2.  Pt will improve grip strength in left hand from unsafe to test to at least 25 lbs for functional use at home and in IADLs. Goal status: INITIAL  3.  Pt will improve A/ROM in left wrist flexion/extension from 33/46 degrees respectively to at least 55 degrees each, to have functional motion for tasks like reach and grasp.  Goal status: INITIAL  4.  Pt will improve strength in left wrist flexion/extension from apparent 3 -/5 MMT to at least 4+/5 MMT to have increased functional ability to carry out selfcare and higher-level homecare tasks with less difficulty. Goal status: INITIAL  5.  Pt will  improve coordination skills in left hand and arm, as seen by within functional limit score on nine-hole peg testing to have increased functional ability to carry out fine motor tasks (fasteners, etc.) and more complex, coordinated IADLs (meal prep, sports, etc.).  Goal status: INITIAL    ASSESSMENT:  CLINICAL IMPRESSION: Patient is a 73 y.o. female who was seen today for occupational therapy for stiffness, weakness, swelling, decreased strength and functional ability in the left hand and arm after distal radius fracture with conservative healing.   Patient presents wearing prefab wrist  splint reports 2/10 pain mostly with supination on ulnar wrist endrange.  Grip and prehension strength was assessed.  Patient also showed great progress in active range of motion for wrist and forearm.  Initiated some strengthening for supination pronation as well as radial ulnar deviation.  Patient can wean into a Benik neoprene wrap with light activities 2 hours on 2 hours off during the day.  Encourage patient to wear her Isotoner glove at nighttime.  Opposition to distal palmar crease of fifth.  Reviewed HEP with cues for hand/shoulder placement and full pain free AROM. The patient will benefit from outpatient occupational therapy to decrease symptoms, improve functional upper extremity use, and increase quality of life.  PERFORMANCE DEFICITS: in functional skills including ADLs, IADLs, coordination, dexterity, edema, ROM, strength, pain, fascial restrictions, Gross motor control, body mechanics, endurance, decreased knowledge of precautions, and UE functional use, cognitive skills including problem solving and safety awareness, and psychosocial skills including coping strategies, environmental adaptation, habits, and routines and behaviors.   IMPAIRMENTS: are limiting patient from ADLs, IADLs, rest and sleep, and leisure.   COMORBIDITIES: may have co-morbidities  that affects occupational performance. Patient will  benefit from skilled OT to address above impairments and improve overall function.  MODIFICATION OR ASSISTANCE TO COMPLETE EVALUATION: No modification of tasks or assist necessary to complete an evaluation.  OT OCCUPATIONAL PROFILE AND HISTORY: Detailed assessment: Review of records and additional review of physical, cognitive, psychosocial history related to current functional performance.  CLINICAL DECISION MAKING: LOW - limited treatment options, no task modification necessary  REHAB POTENTIAL: Excellent  EVALUATION COMPLEXITY: Low      PLAN:  OT FREQUENCY: 1-2x/week  OT DURATION: 6 weeks through 04/08/24 and up to 10 total visits as needed   PLANNED INTERVENTIONS:  02464 self care/ADL training, 02889 therapeutic exercise, 97530 therapeutic activity, 97112 neuromuscular re-education, 97140 manual therapy, 97035 ultrasound, [id number] electrical stimulation (manual), [id number] Orthotic Initial, [id number] Orthotic/Prosthetic subsequent, compression bandaging, Dry needling, energy conservation, coping strategies training, and patient/family education  RECOMMENDED OTHER SERVICES: none now    CONSULTED AND AGREED WITH PLAN OF CARE: Patient  PLAN FOR NEXT SESSION:   Review HEP and recommendations, upgrade to light stretches in next session. After 8 weeks post fx introduce hand strength, at 9 weeks post fx also ok to introduce light FA,wrist strength as well,as tolerated.  Crissy Mccreadie du Preez OTR/L,CLT   03/07/2024, 12:14 PM

## 2024-03-10 ENCOUNTER — Ambulatory Visit: Admitting: Occupational Therapy

## 2024-03-10 DIAGNOSIS — M6281 Muscle weakness (generalized): Secondary | ICD-10-CM

## 2024-03-10 DIAGNOSIS — M25632 Stiffness of left wrist, not elsewhere classified: Secondary | ICD-10-CM

## 2024-03-10 DIAGNOSIS — R278 Other lack of coordination: Secondary | ICD-10-CM | POA: Diagnosis not present

## 2024-03-10 DIAGNOSIS — R600 Localized edema: Secondary | ICD-10-CM

## 2024-03-10 DIAGNOSIS — M25532 Pain in left wrist: Secondary | ICD-10-CM

## 2024-03-10 NOTE — Therapy (Signed)
 OUTPATIENT OCCUPATIONAL THERAPY ORTHO  TREATMENT  Patient Name: Mariah Meadows MRN: [id number] DOB:01/31/1951, 73 y.o., female Today's Date: 03/10/2024  PCP: Jyl FURY MD REFERRING PROVIDER:  Arlinda Buster, MD    END OF SESSION:  OT End of Session - 03/10/24 1716     Visit Number 4    Number of Visits 10    Date for Recertification  04/08/24    OT Start Time 1625    OT Stop Time 1710    OT Time Calculation (min) 45 min    Activity Tolerance Patient tolerated treatment well    Behavior During Therapy [facility] for tasks assessed/performed          Past Medical History:  Diagnosis Date   Breast cancer ([facility]) 2002   lt lumpectomy/radiation LEFT   Personal history of radiation therapy    Skin cancer    Past Surgical History:  Procedure Laterality Date   BASAL CELL CARCINOMA EXCISION Left    BREAST BIOPSY Left 2002   lumpectomy/rad   BREAST LUMPECTOMY Left 2002   positive   COLONOSCOPY     COLONOSCOPY WITH PROPOFOL  N/A 05/27/2018   Procedure: COLONOSCOPY WITH PROPOFOL ;  Surgeon: Gaylyn Gladis PENNER, MD;  Location: [facility] ENDOSCOPY;  Service: Endoscopy;  Laterality: N/A;   There are no active problems to display for this patient.   ONSET DATE: 01/06/24 DOI  REFERRING DIAG: D47.467I (ICD-10-CM) - Closed Colles' fracture of left radius with routine healing, subsequent encounter   THERAPY DIAG:  Muscle weakness (generalized)  Pain in left wrist  Localized edema  Stiffness of left wrist, not elsewhere classified  Rationale for Evaluation and Treatment: Rehabilitation  SUBJECTIVE:   SUBJECTIVE STATEMENT: I feel so much better.  I am using my hand more.  My pain is much better.  With a hard brace I am still wearing it when I walk my dog and wearing it  driving.   PERTINENT HISTORY:  non op left wrist fx   PRECAUTIONS: None  RED FLAGS: None   WEIGHT BEARING RESTRICTIONS: Yes no weight more than 1-2# for next 3 weeks  PAIN:  Are you having pain? 2/10 ulnar  wrist with pronation  FALLS: Has patient fallen in last 6 months? Yes. Number of falls 1 (this accident- not considered a fall risk)    PLOF: Independent  PATIENT GOALS: To improve stiffness, weakness, pain in left wrist and arm  NEXT MD VISIT: 10/8 follow up appnt    OBJECTIVE: (All objective assessments below are from initial evaluation on: 02/26/24 unless otherwise specified.)   HAND DOMINANCE: Right   ADLs: Overall ADLs: States decreased ability to grab, hold household objects, pain and difficulty to open containers, perform FMS tasks (manipulate fasteners on clothing), mild to moderate bathing problems as well.    FUNCTIONAL OUTCOME MEASURES: Eval: Patient Specific Functional Scale: 1.3 (can opener, making a fist, holding 2#+)  (Higher Score  =  Better Ability for the Selected Tasks)       UPPER EXTREMITY ROM     Shoulder to Wrist AROM Left eval  L 03/07/24 L 03/10/24  Forearm supination 64 75 90  Forearm pronation  79 90 90  Wrist flexion 33 45 55  Wrist extension 46 62 70  Functional dart thrower's motion (F-DTM) in ulnar flexion 18    F-DTM in radial extension  45    (Blank rows = not tested)  RD 30 UD 12  Hand AROM Left eval  Full Fist Ability (or Gap to  Distal Palmar Crease) Very loose full fist, esp loose in IF  Thumb Opposition  (Kapandji Scale)  6/10  (Blank rows = not tested) Opposition to distal fold of 5th - 03/07/24  UPPER EXTREMITY MMT:    Eval:  NT at eval due to recent and still healing injuries. Will be tested when appropriate.   MMT Left TBD  Shoulder flexion   Shoulder abduction   Shoulder adduction   Shoulder extension   Shoulder internal rotation   Shoulder external rotation   Middle trapezius   Lower trapezius   Elbow flexion   Elbow extension   Forearm supination   Forearm pronation   Wrist flexion   Wrist extension   Wrist ulnar deviation   Wrist radial deviation   (Blank rows = not tested)  HAND FUNCTION: Eval:  Observed weakness in affected Lt hand.   03/07/24 Grip strength R 60; L 19 lbs, Lat grip R 13; L 10lbs , 3 point pinch R 13 , L 8 lbs  03/10/24 Grip strength R 60; L 25 lbs, Lat grip R 13; L 10lbs , 3 point pinch R 13 , L 10 lbs     COORDINATION: Eval: Observed coordination impairments with affected Lt hand.  9 Hole Peg Test Left:  29sec (27 sec is WFL; 22 is AVG)   SENSATION: Eval:  Light touch intact today  EDEMA:   Eval:  Mildly swollen in Lt hand and wrist today, 17.2cm circumferentially around left distal wrist crease compared to 15.2cm in Rt distal wrist crease   COGNITION: Eval: Overall cognitive status: WFL for evaluation today   OBSERVATIONS:   Eval: She has some typical swelling and soreness weakness and stiffness in the left hand wrist and arm now.   TODAY'S TREATMENT:  03/10/24   Patient arrived with wrist brace in place.  Report wearing it mostly with walking her dog , driving and yard work.   Pain much better Wearing Benik neoprene during the day with functional activities if need some support  Compression glove provided last visit for edema mgmt - encouraged to wear as needed during the day but at nighttime under her brace. Encourage patient to do contrast at home prior to home exercises to decrease edema and decrease pain/stiffness This date encouraged patient to use 2nd and 3rd heat of contrast to facilitate wrist flexion stretch   Paraffin bath done for 8 minutes to increase active range of motion  for wrist flexion  stretches done 2 x 2 minutes   soft tissue massage and mobilization to metacarpals as well as wrist and volar and dorsal forearm.  Done soft tissue mobilization in combination with ulnar radial deviation as well as wrist flexion extension. Done light gentle traction as well as facilitation of rhythmic circular motion of wrist  Reviewed HEP for supination pronation.  Patient in min verbal cueing to keep elbow to the side and not compensate during  supination. Change wrist flexion extension over the armrest to active assistive range tolerated well 12 reps hold 5 seconds Upgrade patient to 2 pounds 12 reps 2 times a day for supination pronation as well as ulnar radial deviation tolerated well  Also at 2 pounds for wrist flexion and extension but with the patient hold for extension  10 reps pain-free  Light blue putty for gripping 12 reps 2 times a day Patient can increase on Sunday to a second set except for wrist flexion extension if pain-free.  Twice a day.  patient can start wearing  Benik neoprene wrist wrap with light activities   Continue with tendon glides as well as thumb palmar abduction and opposition distal fold patient needed min verbal cueing for light motion and not forcing or doing it forceful.   PATIENT EDUCATION: Education details: See tx section above for details  Person educated: Patient Education method: Verbal Instruction, Teach back, Handouts  Education comprehension: States and demonstrates understanding, Additional Education required       GOALS: Goals reviewed with patient? Yes   SHORT TERM GOALS: (STG required if POC>30 days) Target Date: 03/11/24  Pt will obtain protective, custom orthotic. Goal status: TBD/PRN  2.  Pt will demo/state understanding of initial HEP to improve pain levels and prerequisite motion. Goal status: INITIAL   LONG TERM GOALS: Target Date: 04/08/24  Pt will improve functional ability by decreased impairment per PSFS assessment from 1.3 to 7 or better, for better quality of life. Goal status: INITIAL  2.  Pt will improve grip strength in left hand from unsafe to test to at least 25 lbs for functional use at home and in IADLs. Goal status: INITIAL  3.  Pt will improve A/ROM in left wrist flexion/extension from 33/46 degrees respectively to at least 55 degrees each, to have functional motion for tasks like reach and grasp.  Goal status: INITIAL  4.  Pt will improve  strength in left wrist flexion/extension from apparent 3 -/5 MMT to at least 4+/5 MMT to have increased functional ability to carry out selfcare and higher-level homecare tasks with less difficulty. Goal status: INITIAL  5.  Pt will improve coordination skills in left hand and arm, as seen by within functional limit score on nine-hole peg testing to have increased functional ability to carry out fine motor tasks (fasteners, etc.) and more complex, coordinated IADLs (meal prep, sports, etc.).  Goal status: INITIAL    ASSESSMENT:  CLINICAL IMPRESSION: Patient is a 73 y.o. female who was seen today for occupational therapy for stiffness, weakness, swelling, decreased strength and functional ability in the left hand and arm after distal radius fracture with conservative healing.   Patient presents wearing prefab wrist  splint reports 2/10 pain mostly with pronation on ulnar wrist endrange.  Grip and prehension strength was assessed and increased since earlier this week.  Patient also showed again great progress in active range of motion in wrist and forearm in all planes.  Was able to upgrade patient to 2 pounds for wrist and forearm.  Was also able to add light blue putty for gripping..  Encourage patient to wear her Isotoner glove at nighttime.  Opposition to distal palmar crease of fifth.  Patient continue to wear Bennick neoprene with functional task during the day.  Reviewed HEP with cues for hand/shoulder placement and full pain free AROM. The patient will benefit from outpatient occupational therapy to decrease symptoms, improve functional upper extremity use, and increase quality of life.  PERFORMANCE DEFICITS: in functional skills including ADLs, IADLs, coordination, dexterity, edema, ROM, strength, pain, fascial restrictions, Gross motor control, body mechanics, endurance, decreased knowledge of precautions, and UE functional use, cognitive skills including problem solving and safety awareness,  and psychosocial skills including coping strategies, environmental adaptation, habits, and routines and behaviors.   IMPAIRMENTS: are limiting patient from ADLs, IADLs, rest and sleep, and leisure.   COMORBIDITIES: may have co-morbidities  that affects occupational performance. Patient will benefit from skilled OT to address above impairments and improve overall function.  MODIFICATION OR ASSISTANCE TO COMPLETE EVALUATION: No modification  of tasks or assist necessary to complete an evaluation.  OT OCCUPATIONAL PROFILE AND HISTORY: Detailed assessment: Review of records and additional review of physical, cognitive, psychosocial history related to current functional performance.  CLINICAL DECISION MAKING: LOW - limited treatment options, no task modification necessary  REHAB POTENTIAL: Excellent  EVALUATION COMPLEXITY: Low      PLAN:  OT FREQUENCY: 1-2x/week  OT DURATION: 6 weeks through 04/08/24 and up to 10 total visits as needed   PLANNED INTERVENTIONS: 97535 self care/ADL training, 02889 therapeutic exercise, 97530 therapeutic activity, 97112 neuromuscular re-education, 97140 manual therapy, 97035 ultrasound, 97032 electrical stimulation (manual), 97760 Orthotic Initial, [id number] Orthotic/Prosthetic subsequent, compression bandaging, Dry needling, energy conservation, coping strategies training, and patient/family education  RECOMMENDED OTHER SERVICES: none now    CONSULTED AND AGREED WITH PLAN OF CARE: Patient  PLAN FOR NEXT SESSION:   Review HEP and recommendations, upgrade to light stretches in next session. After 8 weeks post fx introduce hand strength, at 9 weeks post fx also ok to introduce light FA,wrist strength as well,as tolerated.  Alleen Kehm du Preez OTR/L,CLT   03/10/2024, 5:17 PM

## 2024-03-15 NOTE — Telephone Encounter (Signed)
 Letter placed in basket to be mailed

## 2024-03-17 ENCOUNTER — Ambulatory Visit: Attending: Orthopedic Surgery | Admitting: Occupational Therapy

## 2024-03-17 DIAGNOSIS — M6281 Muscle weakness (generalized): Secondary | ICD-10-CM | POA: Diagnosis present

## 2024-03-17 DIAGNOSIS — M25532 Pain in left wrist: Secondary | ICD-10-CM | POA: Insufficient documentation

## 2024-03-17 DIAGNOSIS — R600 Localized edema: Secondary | ICD-10-CM | POA: Diagnosis present

## 2024-03-17 DIAGNOSIS — R278 Other lack of coordination: Secondary | ICD-10-CM | POA: Insufficient documentation

## 2024-03-17 DIAGNOSIS — M25632 Stiffness of left wrist, not elsewhere classified: Secondary | ICD-10-CM | POA: Insufficient documentation

## 2024-03-17 NOTE — Therapy (Signed)
 OUTPATIENT OCCUPATIONAL THERAPY ORTHO  TREATMENT  Patient Name: Mariah Meadows MRN: [id number] DOB:11/30/1950, 73 y.o., female Today's Date: 03/17/2024  PCP: Jyl FURY MD REFERRING PROVIDER:  Arlinda Buster, MD    END OF SESSION:  OT End of Session - 03/17/24 0821     Visit Number 5    Number of Visits 10    Date for Recertification  04/08/24    OT Start Time 0820    OT Stop Time 0903    OT Time Calculation (min) 43 min    Activity Tolerance Patient tolerated treatment well    Behavior During Therapy [facility] for tasks assessed/performed          Past Medical History:  Diagnosis Date   Breast cancer (HCC) 2002   lt lumpectomy/radiation LEFT   Personal history of radiation therapy    Skin cancer    Past Surgical History:  Procedure Laterality Date   BASAL CELL CARCINOMA EXCISION Left    BREAST BIOPSY Left 2002   lumpectomy/rad   BREAST LUMPECTOMY Left 2002   positive   COLONOSCOPY     COLONOSCOPY WITH PROPOFOL  N/A 05/27/2018   Procedure: COLONOSCOPY WITH PROPOFOL ;  Surgeon: Gaylyn Gladis PENNER, MD;  Location: [facility] ENDOSCOPY;  Service: Endoscopy;  Laterality: N/A;   There are no active problems to display for this patient.   ONSET DATE: 01/06/24 DOI  REFERRING DIAG: D47.467I (ICD-10-CM) - Closed Colles' fracture of left radius with routine healing, subsequent encounter   THERAPY DIAG:  Muscle weakness (generalized)  Pain in left wrist  Localized edema  Stiffness of left wrist, not elsewhere classified  Other lack of coordination  Rationale for Evaluation and Treatment: Rehabilitation  SUBJECTIVE:   SUBJECTIVE STATEMENT: I did not had 2 lbs so I did 2 1/2 lbs but I only did the rocking exercise and then I was hurting - so did not do the other exercises  - so  I was afraid I did something wrong - sleep with the hard splint   PERTINENT HISTORY:  non op left wrist fx   PRECAUTIONS: None  RED FLAGS: None   WEIGHT BEARING RESTRICTIONS: Yes no weight  more than 1-2# for next 3 weeks  PAIN:  Are you having pain? 1/10 ulnar wrist with pronation   FALLS: Has patient fallen in last 6 months? Yes. Number of falls 1 (this accident- not considered a fall risk)    PLOF: Independent  PATIENT GOALS: To improve stiffness, weakness, pain in left wrist and arm  NEXT MD VISIT: 10/8 follow up appnt    OBJECTIVE: (All objective assessments below are from initial evaluation on: 02/26/24 unless otherwise specified.)   HAND DOMINANCE: Right   ADLs: Overall ADLs: States decreased ability to grab, hold household objects, pain and difficulty to open containers, perform FMS tasks (manipulate fasteners on clothing), mild to moderate bathing problems as well.    FUNCTIONAL OUTCOME MEASURES: Eval: Patient Specific Functional Scale: 1.3 (can opener, making a fist, holding 2#+)  (Higher Score  =  Better Ability for the Selected Tasks)       UPPER EXTREMITY ROM     Shoulder to Wrist AROM Left eval  L 03/07/24 L 03/10/24  Forearm supination 64 75 90  Forearm pronation  79 90 90  Wrist flexion 33 45 55  Wrist extension 46 62 70  Functional dart thrower's motion (F-DTM) in ulnar flexion 18    F-DTM in radial extension  45    (Blank rows = not tested)  RD 30 UD 12  Hand AROM Left eval  Full Fist Ability (or Gap to Distal Palmar Crease) Very loose full fist, esp loose in IF  Thumb Opposition  (Kapandji Scale)  6/10  (Blank rows = not tested) Opposition to distal fold of 5th - 03/07/24  UPPER EXTREMITY MMT:    Eval:  NT at eval due to recent and still healing injuries. Will be tested when appropriate.   MMT Left TBD  Shoulder flexion   Shoulder abduction   Shoulder adduction   Shoulder extension   Shoulder internal rotation   Shoulder external rotation   Middle trapezius   Lower trapezius   Elbow flexion   Elbow extension   Forearm supination   Forearm pronation   Wrist flexion   Wrist extension   Wrist ulnar deviation   Wrist  radial deviation   (Blank rows = not tested)  HAND FUNCTION: Eval: Observed weakness in affected Lt hand.   03/07/24 Grip strength R 60; L 19 lbs, Lat grip R 13; L 10lbs , 3 point pinch R 13 , L 8 lbs  03/10/24 Grip strength R 60; L 25 lbs, Lat grip R 13; L 10lbs , 3 point pinch R 13 , L 10 lbs     COORDINATION: Eval: Observed coordination impairments with affected Lt hand.  9 Hole Peg Test Left:  29sec (27 sec is WFL; 22 is AVG)   SENSATION: Eval:  Light touch intact today  EDEMA:   Eval:  Mildly swollen in Lt hand and wrist today, 17.2cm circumferentially around left distal wrist crease compared to 15.2cm in Rt distal wrist crease   COGNITION: Eval: Overall cognitive status: WFL for evaluation today   OBSERVATIONS:   Eval: She has some typical swelling and soreness weakness and stiffness in the left hand wrist and arm now.   TODAY'S TREATMENT:  03/17/24   Patient arrived with no brace on .  Report wearing it mostly with walking her dog , driving and yard work.   And sleeping Wearing Benik neoprene during the day with functional activities if need some support   Cont Compression glove for edema mgmt - encouraged to wear as needed during the day but at nighttime under her brace but can also try soft brace to see if will help with decrease stiffness  Encourage patient to do contrast at home prior to home exercises to decrease edema and decrease pain/stiffness This date encouraged patient to use 2nd and 3rd heat of contrast to facilitate wrist flexion stretch   Fluidotherapy done for 8 minutes to decrease stiffness and pain increase motion this date   soft tissue massage and mobilization to metacarpals as well as wrist and volar and dorsal forearm.  Done soft tissue mobilization in combination with ulnar radial deviation as well as wrist flexion extension. Done light gentle traction as well as facilitation of rhythmic circular motion of wrist  Reviewed HEP for supination  pronation.  Patient in min verbal cueing to keep elbow to the side and not compensate during supination. Change wrist flexion extension over the armrest to active assistive range tolerated well 12 reps hold 5 seconds Reviewed with patient again 2 pounds 12 reps 2 times a day for supination pronation as well as ulnar radial deviation tolerated well  Hold off on wrist flexion extension this date 10 reps pain-free  But can do elbow flexion and extension 12 reps 2 sets Light blue putty for gripping 12 reps 2 times a day Reinforced with  patient not to force it but gentle range of motion with the weight  Benik neoprene wrist wrap with light activities as well as with supination and pronation strengthening  Continue with tendon glides as well as thumb palmar abduction and opposition distal fold patient needed min verbal cueing for light motion and not forcing or doing it forceful.   PATIENT EDUCATION: Education details: See tx section above for details  Person educated: Patient Education method: Verbal Instruction, Teach back, Handouts  Education comprehension: States and demonstrates understanding, Additional Education required       GOALS: Goals reviewed with patient? Yes   SHORT TERM GOALS: (STG required if POC>30 days) Target Date: 03/11/24  Pt will obtain protective, custom orthotic. Goal status: TBD/PRN  2.  Pt will demo/state understanding of initial HEP to improve pain levels and prerequisite motion. Goal status: INITIAL   LONG TERM GOALS: Target Date: 04/08/24  Pt will improve functional ability by decreased impairment per PSFS assessment from 1.3 to 7 or better, for better quality of life. Goal status: INITIAL  2.  Pt will improve grip strength in left hand from unsafe to test to at least 25 lbs for functional use at home and in IADLs. Goal status: INITIAL  3.  Pt will improve A/ROM in left wrist flexion/extension from 33/46 degrees respectively to at least 55 degrees  each, to have functional motion for tasks like reach and grasp.  Goal status: INITIAL  4.  Pt will improve strength in left wrist flexion/extension from apparent 3 -/5 MMT to at least 4+/5 MMT to have increased functional ability to carry out selfcare and higher-level homecare tasks with less difficulty. Goal status: INITIAL  5.  Pt will improve coordination skills in left hand and arm, as seen by within functional limit score on nine-hole peg testing to have increased functional ability to carry out fine motor tasks (fasteners, etc.) and more complex, coordinated IADLs (meal prep, sports, etc.).  Goal status: INITIAL    ASSESSMENT:  CLINICAL IMPRESSION: Patient is a 73 y.o. female who was seen today for occupational therapy for stiffness, weakness, swelling, decreased strength and functional ability in the left hand and arm after distal radius fracture with conservative healing.   Last visit patient was showing great progress in left wrist and digit active range of motion as well as pain and edema.  Was able to initiate strengthening.  But appear over the weekend patient did not had a 2 pound weight but attempted to use a 2-1/2 pound forcing radial ulnar deviation causing increased pain and fear and anxiety.  This date patient arrived with 1/10 pain with still little ability to maintain her progress that she had last time.  Patient to continue to focus on range of motion for wrist flexion extension.  Reviewed with her again 2 pounds strengthening for wrist and forearm.  Reinforced with her to do it lightly.  And can use the Benik neoprene wrap for supination pronation.  Continue with light blue putty for gripping..  Encourage patient to wear her Isotoner glove at nighttime.  Opposition to distal palmar crease of fifth.  Patient continue to wear Bennick neoprene with functional task during the day.  Reviewed HEP with cues for hand/shoulder placement and full pain free AROM. The patient will benefit  from outpatient occupational therapy to decrease symptoms, improve functional upper extremity use, and increase quality of life.  PERFORMANCE DEFICITS: in functional skills including ADLs, IADLs, coordination, dexterity, edema, ROM, strength, pain, fascial restrictions, Gross  motor control, body mechanics, endurance, decreased knowledge of precautions, and UE functional use, cognitive skills including problem solving and safety awareness, and psychosocial skills including coping strategies, environmental adaptation, habits, and routines and behaviors.   IMPAIRMENTS: are limiting patient from ADLs, IADLs, rest and sleep, and leisure.   COMORBIDITIES: may have co-morbidities  that affects occupational performance. Patient will benefit from skilled OT to address above impairments and improve overall function.  MODIFICATION OR ASSISTANCE TO COMPLETE EVALUATION: No modification of tasks or assist necessary to complete an evaluation.  OT OCCUPATIONAL PROFILE AND HISTORY: Detailed assessment: Review of records and additional review of physical, cognitive, psychosocial history related to current functional performance.  CLINICAL DECISION MAKING: LOW - limited treatment options, no task modification necessary  REHAB POTENTIAL: Excellent  EVALUATION COMPLEXITY: Low      PLAN:  OT FREQUENCY: 1-2x/week  OT DURATION: 6 weeks through 04/08/24 and up to 10 total visits as needed   PLANNED INTERVENTIONS: 97535 self care/ADL training, 02889 therapeutic exercise, 97530 therapeutic activity, 97112 neuromuscular re-education, 97140 manual therapy, 97035 ultrasound, 97032 electrical stimulation (manual), 97760 Orthotic Initial, [id number] Orthotic/Prosthetic subsequent, compression bandaging, Dry needling, energy conservation, coping strategies training, and patient/family education  RECOMMENDED OTHER SERVICES: none now    CONSULTED AND AGREED WITH PLAN OF CARE: Patient  PLAN FOR NEXT SESSION:   Review HEP  and recommendations, upgrade to light stretches in next session. After 8 weeks post fx introduce hand strength, at 9 weeks post fx also ok to introduce light FA,wrist strength as well,as tolerated.  Chloee Tena du Preez OTR/L,CLT   03/17/2024, 9:19 PM

## 2024-03-22 ENCOUNTER — Ambulatory Visit: Admitting: Occupational Therapy

## 2024-03-22 ENCOUNTER — Encounter: Payer: Self-pay | Admitting: Occupational Therapy

## 2024-03-22 DIAGNOSIS — R600 Localized edema: Secondary | ICD-10-CM

## 2024-03-22 DIAGNOSIS — M25632 Stiffness of left wrist, not elsewhere classified: Secondary | ICD-10-CM

## 2024-03-22 DIAGNOSIS — M25532 Pain in left wrist: Secondary | ICD-10-CM

## 2024-03-22 DIAGNOSIS — M6281 Muscle weakness (generalized): Secondary | ICD-10-CM | POA: Diagnosis not present

## 2024-03-22 NOTE — Therapy (Signed)
 OUTPATIENT OCCUPATIONAL THERAPY ORTHO  TREATMENT  Patient Name: Mariah Meadows MRN: [id number] DOB:05-Apr-1951, 73 y.o., female Today's Date: 03/22/2024  PCP: Jyl FURY MD REFERRING PROVIDER:  Arlinda Buster, MD    END OF SESSION:  OT End of Session - 03/22/24 0902     Visit Number 6    Number of Visits 10    Date for Recertification  04/08/24    OT Start Time 0900    OT Stop Time 0945    OT Time Calculation (min) 45 min    Equipment Utilized During Treatment Compressive sleeve    Activity Tolerance Patient tolerated treatment well    Behavior During Therapy [facility] for tasks assessed/performed           Past Medical History:  Diagnosis Date   Breast cancer (HCC) 2002   lt lumpectomy/radiation LEFT   Personal history of radiation therapy    Skin cancer    Past Surgical History:  Procedure Laterality Date   BASAL CELL CARCINOMA EXCISION Left    BREAST BIOPSY Left 2002   lumpectomy/rad   BREAST LUMPECTOMY Left 2002   positive   COLONOSCOPY     COLONOSCOPY WITH PROPOFOL  N/A 05/27/2018   Procedure: COLONOSCOPY WITH PROPOFOL ;  Surgeon: Gaylyn Gladis PENNER, MD;  Location: [facility] ENDOSCOPY;  Service: Endoscopy;  Laterality: N/A;   There are no active problems to display for this patient.   ONSET DATE: 01/06/24 DOI  REFERRING DIAG: D47.467I (ICD-10-CM) - Closed Colles' fracture of left radius with routine healing, subsequent encounter   THERAPY DIAG:  Pain in left wrist  Localized edema  Stiffness of left wrist, not elsewhere classified  Rationale for Evaluation and Treatment: Rehabilitation  SUBJECTIVE:   SUBJECTIVE STATEMENT: I didn't sleep with the compression glove on then I woke up with pain and put it on in the middle of the night.   PERTINENT HISTORY:  non op left wrist fx   PRECAUTIONS: None  RED FLAGS: None   WEIGHT BEARING RESTRICTIONS: Yes no weight more than 1-2# for next 3 weeks  PAIN:  Are you having pain? 1/10 ulnar wrist with pronation    FALLS: Has patient fallen in last 6 months? Yes. Number of falls 1 (this accident- not considered a fall risk)    PLOF: Independent  PATIENT GOALS: To improve stiffness, weakness, pain in left wrist and arm  NEXT MD VISIT: 10/8 follow up appnt    OBJECTIVE: (All objective assessments below are from initial evaluation on: 02/26/24 unless otherwise specified.)   HAND DOMINANCE: Right   ADLs: Overall ADLs: States decreased ability to grab, hold household objects, pain and difficulty to open containers, perform FMS tasks (manipulate fasteners on clothing), mild to moderate bathing problems as well.    FUNCTIONAL OUTCOME MEASURES: Eval: Patient Specific Functional Scale: 1.3 (can opener, making a fist, holding 2#+)  (Higher Score  =  Better Ability for the Selected Tasks)       UPPER EXTREMITY ROM     Shoulder to Wrist AROM Left eval  L 03/07/24 L 03/10/24  Forearm supination 64 75 90  Forearm pronation  79 90 90  Wrist flexion 33 45 55  Wrist extension 46 62 70  Functional dart thrower's motion (F-DTM) in ulnar flexion 18    F-DTM in radial extension  45    (Blank rows = not tested)  RD 30 UD 12  Hand AROM Left eval  Full Fist Ability (or Gap to Distal Palmar Crease) Very loose full fist,  esp loose in IF  Thumb Opposition  (Kapandji Scale)  6/10  (Blank rows = not tested) Opposition to distal fold of 5th - 03/07/24  UPPER EXTREMITY MMT:    Eval:  NT at eval due to recent and still healing injuries. Will be tested when appropriate.   MMT Left TBD  Shoulder flexion   Shoulder abduction   Shoulder adduction   Shoulder extension   Shoulder internal rotation   Shoulder external rotation   Middle trapezius   Lower trapezius   Elbow flexion   Elbow extension   Forearm supination   Forearm pronation   Wrist flexion   Wrist extension   Wrist ulnar deviation   Wrist radial deviation   (Blank rows = not tested)  HAND FUNCTION: Eval: Observed weakness in  affected Lt hand.   03/07/24 Grip strength R 60; L 19 lbs, Lat grip R 13; L 10lbs , 3 point pinch R 13 , L 8 lbs  03/10/24 Grip strength R 60; L 25 lbs, Lat grip R 13; L 10lbs , 3 point pinch R 13 , L 10 lbs     COORDINATION: Eval: Observed coordination impairments with affected Lt hand.  9 Hole Peg Test Left:  29sec (27 sec is WFL; 22 is AVG)   SENSATION: Eval:  Light touch intact today  EDEMA:   Eval:  Mildly swollen in Lt hand and wrist today, 17.2cm circumferentially around left distal wrist crease compared to 15.2cm in Rt distal wrist crease   COGNITION: Eval: Overall cognitive status: WFL for evaluation today   OBSERVATIONS:   Eval: She has some typical swelling and soreness weakness and stiffness in the left hand wrist and arm now.   TODAY'S TREATMENT:  03/22/24   Arrived with Benik brace on, reports removing compression glove last night and waking up from pain/edema. Educated on wearing throughout night.  Encourage patient to do contrast at home prior to home exercises to decrease edema and decrease pain/stiffness. Fluidotherapy done for 8 minutes to decrease stiffness and pain increase motion this date with wrist AROM during.  Soft tissue massage and mobilization to metacarpals as well as wrist and volar and dorsal forearm. Open and close fist wrist ext/flex with massage  Holds 4# weight to carry and supinate/pronate. Able to open/close foor with L hand, requires significant trunk compensation to open  Provided red theraband for shoulder strengthening 2 set x 10 reps scapular protraction and retraction Educated on cane stretches for L shoulder pain 2/10/tightness, completed in sitting with plan to trial in supine at home. Reviewed HEP wrist flexion extension 10 reps pain-free  Benik neoprene wrist wrap with light activities as well as with supination and pronation strengthening Continue with tendon glides as well as thumb palmar abduction and opposition distal fold patient  needed min verbal cueing for light motion   PATIENT EDUCATION: Education details: See tx section above for details  Person educated: Patient Education method: Engineer, structural, Teach back, Handouts  Education comprehension: States and demonstrates understanding, Additional Education required       GOALS: Goals reviewed with patient? Yes   SHORT TERM GOALS: (STG required if POC>30 days) Target Date: 03/11/24  Pt will obtain protective, custom orthotic. Goal status: TBD/PRN  2.  Pt will demo/state understanding of initial HEP to improve pain levels and prerequisite motion. Goal status: INITIAL   LONG TERM GOALS: Target Date: 04/08/24  Pt will improve functional ability by decreased impairment per PSFS assessment from 1.3 to 7 or better,  for better quality of life. Goal status: INITIAL  2.  Pt will improve grip strength in left hand from unsafe to test to at least 25 lbs for functional use at home and in IADLs. Goal status: INITIAL  3.  Pt will improve A/ROM in left wrist flexion/extension from 33/46 degrees respectively to at least 55 degrees each, to have functional motion for tasks like reach and grasp.  Goal status: INITIAL  4.  Pt will improve strength in left wrist flexion/extension from apparent 3 -/5 MMT to at least 4+/5 MMT to have increased functional ability to carry out selfcare and higher-level homecare tasks with less difficulty. Goal status: INITIAL  5.  Pt will improve coordination skills in left hand and arm, as seen by within functional limit score on nine-hole peg testing to have increased functional ability to carry out fine motor tasks (fasteners, etc.) and more complex, coordinated IADLs (meal prep, sports, etc.).  Goal status: INITIAL    ASSESSMENT:  CLINICAL IMPRESSION: Patient is a 73 y.o. female who was seen today for occupational therapy for stiffness, weakness, swelling, decreased strength and functional ability in the left hand and arm  after distal radius fracture with conservative healing. Pt continues to report new L shoulder tightness/pain limiting activity - reviewed cane stretches and provided red theraband for shoulder protraction/retraction exercises in standing. Pt able to carry 4# weight and supinate/pronate. Able to open/close foor with L hand, requires significant trunk compensation to open.  Patient to continue to focus on range of motion for wrist flexion extension, reports 1/10 pain at wrist with all ROM. Ongoing education to wear Isotoner glove at nighttime as pt removed last night and reports increased pain. Reviewed with her again 2 pounds strengthening for wrist and forearm. Continue with light blue putty for gripping. Patient continue to wear Bennick neoprene with functional task during the day. The patient will benefit from outpatient occupational therapy to decrease symptoms, improve functional upper extremity use, and increase quality of life.  PERFORMANCE DEFICITS: in functional skills including ADLs, IADLs, coordination, dexterity, edema, ROM, strength, pain, fascial restrictions, Gross motor control, body mechanics, endurance, decreased knowledge of precautions, and UE functional use, cognitive skills including problem solving and safety awareness, and psychosocial skills including coping strategies, environmental adaptation, habits, and routines and behaviors.   IMPAIRMENTS: are limiting patient from ADLs, IADLs, rest and sleep, and leisure.   COMORBIDITIES: may have co-morbidities  that affects occupational performance. Patient will benefit from skilled OT to address above impairments and improve overall function.  MODIFICATION OR ASSISTANCE TO COMPLETE EVALUATION: No modification of tasks or assist necessary to complete an evaluation.  OT OCCUPATIONAL PROFILE AND HISTORY: Detailed assessment: Review of records and additional review of physical, cognitive, psychosocial history related to current functional  performance.  CLINICAL DECISION MAKING: LOW - limited treatment options, no task modification necessary  REHAB POTENTIAL: Excellent  EVALUATION COMPLEXITY: Low      PLAN:  OT FREQUENCY: 1-2x/week  OT DURATION: 6 weeks through 04/08/24 and up to 10 total visits as needed   PLANNED INTERVENTIONS: 97535 self care/ADL training, 02889 therapeutic exercise, 97530 therapeutic activity, 97112 neuromuscular re-education, 97140 manual therapy, 97035 ultrasound, 97032 electrical stimulation (manual), 97760 Orthotic Initial, [id number] Orthotic/Prosthetic subsequent, compression bandaging, Dry needling, energy conservation, coping strategies training, and patient/family education  RECOMMENDED OTHER SERVICES: none now    CONSULTED AND AGREED WITH PLAN OF CARE: Patient  PLAN FOR NEXT SESSION:   Review HEP and recommendations, upgrade to light stretches in next  session. After 8 weeks post fx introduce hand strength, at 9 weeks post fx also ok to introduce light FA,wrist strength as well,as tolerated.    Elston Slot, M.S. OTR/L  03/22/24, 9:04 AM  ascom [phone]   03/22/2024, 9:04 AM

## 2024-03-23 ENCOUNTER — Other Ambulatory Visit: Payer: Self-pay

## 2024-03-23 ENCOUNTER — Ambulatory Visit: Admitting: Orthopedic Surgery

## 2024-03-23 DIAGNOSIS — S52532D Colles' fracture of left radius, subsequent encounter for closed fracture with routine healing: Secondary | ICD-10-CM

## 2024-03-23 NOTE — Progress Notes (Unsigned)
 Mariah Meadows - 73 y.o. female MRN [id number]  Date of birth: 21-Feb-1951  Office Visit Note: Visit Date: 03/23/2024 PCP: Jyl Railing, MD Referred by: Jyl Railing, MD  Subjective: No chief complaint on file.  HPI: Mariah Meadows is a pleasant 73 y.o. female who presents today for evaluation of a left wrist fracture sustained approximately 11 weeks prior. She has been managed nonoperatively with casting with transition to splint, doing OT and progressing well.   Pertinent ROS were reviewed with the patient and found to be negative unless otherwise specified above in HPI.    Assessment & Plan: Visit Diagnoses:  1. Closed Colles' fracture of left radius with routine healing, subsequent encounter     Plan: Repeat x-rays were obtained today of the left wrist fracture.  She is demonstrating appropriate healing of the distal radius fracture with moderate residual ulnar positivity.  Fortunately this remains relatively asymptomatic on examination today.  Her prior stiffness is responding well to OT.  We did discuss in detail her x-rays today which do show the residual ulnar positivity.  I explained that should she become symptomatic in the future on the ulnar aspect of the wrist, we could always discuss the possibility of ulnar shortening osteotomy as needed.  She expressed full understanding.  At this juncture she is cleared for activity as tolerated and can advance motion and strengthening with OT as tolerated.   Follow-up: No follow-ups on file.   Meds & Orders: No orders of the defined types were placed in this encounter.   Orders Placed This Encounter  Procedures   XR Wrist Complete Left     Procedures: No procedures performed      Clinical History: No specialty comments available.  She reports that she has quit smoking. Her smoking use included cigarettes. She has never used smokeless tobacco. No results for input(s): HGBA1C, LABURIC in the last 8760  hours.  Objective:   Vital Signs: There were no vitals taken for this visit.  Physical Exam  Gen: Well-appearing, in no acute distress; non-toxic CV: Regular Rate. Well-perfused. Warm.  Resp: Breathing unlabored on room air; no wheezing. Psych: Fluid speech in conversation; appropriate affect; normal thought process  Ortho Exam Left wrist: - Minimal tenderness to deep palpation at the distal radius and distal ulna region - Wrist range of motion flexion/extension 65/55 - Near composite fist passively - Rotation pronation/supination 80/80, no significant DRUJ instability with gentle stress testing - Minimal pain with ulnar deviation, no significant foveal tenderness  Imaging: XR Wrist Complete Left Result Date: 03/24/2024 XR of the left wrist is demonstrating appropriate interval healing of the distal radius fracture, there is evidence of shortening with residual ulnar positivity.     Past Medical/Family/Surgical/Social History: Medications & Allergies reviewed per EMR, new medications updated. There are no active problems to display for this patient.  Past Medical History:  Diagnosis Date   Breast cancer (HCC) 2002   lt lumpectomy/radiation LEFT   Personal history of radiation therapy    Skin cancer    Family History  Adopted: Yes  Problem Relation Age of Onset   Breast cancer Daughter 13   Past Surgical History:  Procedure Laterality Date   BASAL CELL CARCINOMA EXCISION Left    BREAST BIOPSY Left 2002   lumpectomy/rad   BREAST LUMPECTOMY Left 2002   positive   COLONOSCOPY     COLONOSCOPY WITH PROPOFOL  N/A 05/27/2018   Procedure: COLONOSCOPY WITH PROPOFOL ;  Surgeon: Gaylyn Gladis PENNER, MD;  Location: [facility] ENDOSCOPY;  Service: Endoscopy;  Laterality: N/A;   Social History   Occupational History   Not on file  Tobacco Use   Smoking status: Former    Current packs/day: 0.30    Types: Cigarettes   Smokeless tobacco: Never  Vaping Use   Vaping status: Never  Used  Substance and Sexual Activity   Alcohol use: Yes    Comment: rarely   Drug use: Never   Sexual activity: Not on file    Mariah Meadows, M.D. [facility], Hand Surgery

## 2024-03-25 ENCOUNTER — Encounter: Payer: Self-pay | Admitting: Occupational Therapy

## 2024-03-25 ENCOUNTER — Ambulatory Visit: Admitting: Occupational Therapy

## 2024-03-25 DIAGNOSIS — R278 Other lack of coordination: Secondary | ICD-10-CM

## 2024-03-25 DIAGNOSIS — M25532 Pain in left wrist: Secondary | ICD-10-CM

## 2024-03-25 DIAGNOSIS — R600 Localized edema: Secondary | ICD-10-CM

## 2024-03-25 DIAGNOSIS — M6281 Muscle weakness (generalized): Secondary | ICD-10-CM | POA: Diagnosis not present

## 2024-03-25 NOTE — Therapy (Signed)
 OUTPATIENT OCCUPATIONAL THERAPY ORTHO  TREATMENT  Patient Name: Mariah Meadows MRN: [id number] DOB:1950/11/02, 73 y.o., female Today's Date: 03/25/2024  PCP: Jyl FURY MD REFERRING PROVIDER:  Arlinda Buster, MD    END OF SESSION:  OT End of Session - 03/25/24 0945     Visit Number 7    Number of Visits 10    Date for Recertification  04/08/24    OT Start Time 0945    OT Stop Time 1030    OT Time Calculation (min) 45 min    Equipment Utilized During Treatment Compressive sleeve    Activity Tolerance Patient tolerated treatment well    Behavior During Therapy [facility] for tasks assessed/performed            Past Medical History:  Diagnosis Date   Breast cancer (HCC) 2002   lt lumpectomy/radiation LEFT   Personal history of radiation therapy    Skin cancer    Past Surgical History:  Procedure Laterality Date   BASAL CELL CARCINOMA EXCISION Left    BREAST BIOPSY Left 2002   lumpectomy/rad   BREAST LUMPECTOMY Left 2002   positive   COLONOSCOPY     COLONOSCOPY WITH PROPOFOL  N/A 05/27/2018   Procedure: COLONOSCOPY WITH PROPOFOL ;  Surgeon: Gaylyn Gladis PENNER, MD;  Location: [facility] ENDOSCOPY;  Service: Endoscopy;  Laterality: N/A;   There are no active problems to display for this patient.   ONSET DATE: 01/06/24 DOI  REFERRING DIAG: D47.467I (ICD-10-CM) - Closed Colles' fracture of left radius with routine healing, subsequent encounter   THERAPY DIAG:  Other lack of coordination  Pain in left wrist  Localized edema  Rationale for Evaluation and Treatment: Rehabilitation  SUBJECTIVE:   SUBJECTIVE STATEMENT: My shoulder feels better after doing those stretches. States she had her MD appointment this week and he wants her to decrease brace wearing. Pt mainly wearing brace only when walking the dog.   PERTINENT HISTORY:  non op left wrist fx   PRECAUTIONS: None  RED FLAGS: None   WEIGHT BEARING RESTRICTIONS: Yes no weight more than 1-2# for next 3  weeks  PAIN:  Are you having pain? 1/10 ulnar wrist with pronation , 3/10 shoulder  FALLS: Has patient fallen in last 6 months? Yes. Number of falls 1 (this accident- not considered a fall risk)    PLOF: Independent  PATIENT GOALS: To improve stiffness, weakness, pain in left wrist and arm  NEXT MD VISIT: 10/8 follow up appnt    OBJECTIVE: (All objective assessments below are from initial evaluation on: 02/26/24 unless otherwise specified.)   HAND DOMINANCE: Right   ADLs: Overall ADLs: States decreased ability to grab, hold household objects, pain and difficulty to open containers, perform FMS tasks (manipulate fasteners on clothing), mild to moderate bathing problems as well.    FUNCTIONAL OUTCOME MEASURES: Eval: Patient Specific Functional Scale: 1.3 (can opener, making a fist, holding 2#+)  (Higher Score  =  Better Ability for the Selected Tasks)       UPPER EXTREMITY ROM     Shoulder to Wrist AROM Left eval  L 03/07/24 L 03/10/24 L  03/25/24  Forearm supination 64 75 90   Forearm pronation  79 90 90   Wrist flexion 33 45 55 55  Wrist extension 46 62 70 65  Functional dart thrower's motion (F-DTM) in ulnar flexion 18     F-DTM in radial extension  45     (Blank rows = not tested)  RD 30 UD 12  Hand AROM Left eval  Full Fist Ability (or Gap to Distal Palmar Crease) Very loose full fist, esp loose in IF  Thumb Opposition  (Kapandji Scale)  6/10  (Blank rows = not tested) Opposition to distal fold of 5th - 03/07/24  UPPER EXTREMITY MMT:    Eval:  NT at eval due to recent and still healing injuries. Will be tested when appropriate.    HAND FUNCTION: Eval: Observed weakness in affected Lt hand.   03/07/24 Grip strength R 60; L 19 lbs, Lat grip R 13; L 10lbs , 3 point pinch R 13 , L 8 lbs  03/10/24 Grip strength R 60; L 25 lbs, Lat grip R 13; L 10lbs , 3 point pinch R 13 , L 10 lbs  03/25/24 Grip strength R 65; L 29 lbs, Lat grip R 11; L 9lbs , 3 point pinch R 13  , L 9 lbs    COORDINATION: Eval: Observed coordination impairments with affected Lt hand.  9 Hole Peg Test Left:  29sec (27 sec is WFL; 22 is AVG)   SENSATION: Eval:  Light touch intact today  EDEMA:   Eval:  Mildly swollen in Lt hand and wrist today, 17.2cm circumferentially around left distal wrist crease compared to 15.2cm in Rt distal wrist crease  03/25/24: 16.5 cm L distal wrist crease   COGNITION: Eval: Overall cognitive status: WFL for evaluation today   OBSERVATIONS:   Eval: She has some typical swelling and soreness weakness and stiffness in the left hand wrist and arm now.   TODAY'S TREATMENT:  03/25/24   Educated on wearing compression glove while watching Jeopardy or reading. Reports MD request pt to wean from brace. Pt only wearing when walking dog or heavy physical activity. Continue to wear when walking dog due to anxiousness and pt reporting needing confidence, remove for all other activity if pain free.  Fluidotherapy done for 8 minutes to decrease stiffness and pain with wrist AROM during.   Soft tissue massage and mobilization to metacarpals as well as wrist and volar and dorsal forearm. Open and close fist wrist ext/flex with massage  Holds 5# weight to carry and supinate, difficulty with pronation. Able to open/close foor with L hand with less trunk involvement and no brace on.  Upgraded to green theraband for shoulder strengthening, completed 1 set x 10 reps scapular protraction and retraction Educated on continuing cane stretches for L shoulder 2/10 pain / tightness  Reviewed HEP, added 2# wrist supination/pronation 10 reps, 2# wrist flexion/extension holding 3 sec for stretch and forearm to isolate wrist movement 10 reps each, and prayer stretch - cues to bring hands to chest.   Continue with tendon glides as well as thumb palmar abduction and opposition distal fold patient needed min verbal cueing for light motion   PATIENT EDUCATION: Education details:  See tx section above for details  Person educated: Patient Education method: Verbal Instruction, Teach back, Handouts  Education comprehension: States and demonstrates understanding, Additional Education required       GOALS: Goals reviewed with patient? Yes   SHORT TERM GOALS: (STG required if POC>30 days) Target Date: 03/11/24  Pt will obtain protective, custom orthotic. Goal status: TBD/PRN  2.  Pt will demo/state understanding of initial HEP to improve pain levels and prerequisite motion. Goal status: INITIAL   LONG TERM GOALS: Target Date: 04/08/24  Pt will improve functional ability by decreased impairment per PSFS assessment from 1.3 to 7 or better, for better quality of life.  Goal status: INITIAL  2.  Pt will improve grip strength in left hand from unsafe to test to at least 25 lbs for functional use at home and in IADLs. Goal status: INITIAL  3.  Pt will improve A/ROM in left wrist flexion/extension from 33/46 degrees respectively to at least 55 degrees each, to have functional motion for tasks like reach and grasp.  Goal status: INITIAL  4.  Pt will improve strength in left wrist flexion/extension from apparent 3 -/5 MMT to at least 4+/5 MMT to have increased functional ability to carry out selfcare and higher-level homecare tasks with less difficulty. Goal status: INITIAL  5.  Pt will improve coordination skills in left hand and arm, as seen by within functional limit score on nine-hole peg testing to have increased functional ability to carry out fine motor tasks (fasteners, etc.) and more complex, coordinated IADLs (meal prep, sports, etc.).  Goal status: INITIAL    ASSESSMENT:  CLINICAL IMPRESSION: Patient is a 73 y.o. female who was seen today for occupational therapy for stiffness, weakness, swelling, decreased strength and functional ability in the left hand and arm after distal radius fracture with conservative healing. Reports L shoulder pain improving  with cane stretches. Upgraded to green theraband for scapular retraction/protraction exercises, cues for form. Pt able to carry 5# weight and supinate, unable to pronate. Able to open/close foor with L hand, no brace and less trunk compensation.  Patient to continue to focus on range of motion for wrist flexion extension, reports 1/10 pain at wrist with all ROM.  Edema at L distal wrist crease decrease from 17.2cm to 16.5cm, continues to report swelling limiting funcitonal grip and ROM. Ongoing education to wear Isotoner glove at nighttime and add daytime during TV/reading. MD requesting pt wean out of [place] neoprene brace, plan to wear with dog walking only. The patient will benefit from outpatient occupational therapy to decrease symptoms, improve functional upper extremity use, and increase quality of life.  PERFORMANCE DEFICITS: in functional skills including ADLs, IADLs, coordination, dexterity, edema, ROM, strength, pain, fascial restrictions, Gross motor control, body mechanics, endurance, decreased knowledge of precautions, and UE functional use, cognitive skills including problem solving and safety awareness, and psychosocial skills including coping strategies, environmental adaptation, habits, and routines and behaviors.   IMPAIRMENTS: are limiting patient from ADLs, IADLs, rest and sleep, and leisure.   COMORBIDITIES: may have co-morbidities  that affects occupational performance. Patient will benefit from skilled OT to address above impairments and improve overall function.  MODIFICATION OR ASSISTANCE TO COMPLETE EVALUATION: No modification of tasks or assist necessary to complete an evaluation.  OT OCCUPATIONAL PROFILE AND HISTORY: Detailed assessment: Review of records and additional review of physical, cognitive, psychosocial history related to current functional performance.  CLINICAL DECISION MAKING: LOW - limited treatment options, no task modification necessary  REHAB POTENTIAL:  Excellent  EVALUATION COMPLEXITY: Low      PLAN:  OT FREQUENCY: 1-2x/week  OT DURATION: 6 weeks through 04/08/24 and up to 10 total visits as needed   PLANNED INTERVENTIONS: 97535 self care/ADL training, 02889 therapeutic exercise, 97530 therapeutic activity, 97112 neuromuscular re-education, 97140 manual therapy, 97035 ultrasound, 97032 electrical stimulation (manual), 97760 Orthotic Initial, [id number] Orthotic/Prosthetic subsequent, compression bandaging, Dry needling, energy conservation, coping strategies training, and patient/family education  RECOMMENDED OTHER SERVICES: none now    CONSULTED AND AGREED WITH PLAN OF CARE: Patient  PLAN FOR NEXT SESSION:   Review HEP and recommendations, upgrade to light stretches in next session. After 8 weeks  post fx introduce hand strength, at 9 weeks post fx also ok to introduce light FA,wrist strength as well,as tolerated.    Elston Slot, M.S. OTR/L  03/25/24, 9:46 AM  ascom [phone]   03/25/2024, 9:46 AM

## 2024-03-29 ENCOUNTER — Ambulatory Visit: Admitting: Occupational Therapy

## 2024-03-29 ENCOUNTER — Encounter: Payer: Self-pay | Admitting: Occupational Therapy

## 2024-03-29 DIAGNOSIS — R278 Other lack of coordination: Secondary | ICD-10-CM

## 2024-03-29 DIAGNOSIS — M25532 Pain in left wrist: Secondary | ICD-10-CM

## 2024-03-29 DIAGNOSIS — M6281 Muscle weakness (generalized): Secondary | ICD-10-CM | POA: Diagnosis not present

## 2024-03-29 DIAGNOSIS — R600 Localized edema: Secondary | ICD-10-CM

## 2024-03-29 NOTE — Therapy (Signed)
 OUTPATIENT OCCUPATIONAL THERAPY ORTHO  TREATMENT  Patient Name: Mariah Meadows MRN: [id number] DOB:[birth year], 73 y.o., female Today's Date: 03/29/2024  PCP: Jyl FURY MD REFERRING PROVIDER:  Arlinda Buster, MD    END OF SESSION:  OT End of Session - 03/29/24 0946     Visit Number 8    Number of Visits 10    Date for Recertification  04/08/24    OT Start Time 0945    OT Stop Time 1030    OT Time Calculation (min) 45 min    Equipment Utilized During Treatment Compressive sleeve    Activity Tolerance Patient tolerated treatment well    Behavior During Therapy [facility] for tasks assessed/performed             Past Medical History:  Diagnosis Date   Breast cancer (HCC) 2002   lt lumpectomy/radiation LEFT   Personal history of radiation therapy    Skin cancer    Past Surgical History:  Procedure Laterality Date   BASAL CELL CARCINOMA EXCISION Left    BREAST BIOPSY Left 2002   lumpectomy/rad   BREAST LUMPECTOMY Left 2002   positive   COLONOSCOPY     COLONOSCOPY WITH PROPOFOL  N/A 05/27/2018   Procedure: COLONOSCOPY WITH PROPOFOL ;  Surgeon: Gaylyn Gladis PENNER, MD;  Location: [facility] ENDOSCOPY;  Service: Endoscopy;  Laterality: N/A;   There are no active problems to display for this patient.   ONSET DATE: 01/06/24 DOI  REFERRING DIAG: D47.467I (ICD-10-CM) - Closed Colles' fracture of left radius with routine healing, subsequent encounter   THERAPY DIAG:  Other lack of coordination  Localized edema  Pain in left wrist  Rationale for Evaluation and Treatment: Rehabilitation  SUBJECTIVE:   SUBJECTIVE STATEMENT: Reports 90% not wearing the brace. I tied my shoes and the coordination felt better.  PERTINENT HISTORY:  non op left wrist fx   PRECAUTIONS: None  RED FLAGS: None   WEIGHT BEARING RESTRICTIONS: Yes no weight more than 1-2# for next 3 weeks  PAIN:  Are you having pain? 1/10 ulnar wrist with pronation , 3/10 shoulder  FALLS: Has patient  fallen in last 6 months? Yes. Number of falls 1 (this accident- not considered a fall risk)    PLOF: Independent  PATIENT GOALS: To improve stiffness, weakness, pain in left wrist and arm  NEXT MD VISIT: 10/8 follow up appnt    OBJECTIVE: (All objective assessments below are from initial evaluation on: 02/26/24 unless otherwise specified.)   HAND DOMINANCE: Right   ADLs: Overall ADLs: States decreased ability to grab, hold household objects, pain and difficulty to open containers, perform FMS tasks (manipulate fasteners on clothing), mild to moderate bathing problems as well.    FUNCTIONAL OUTCOME MEASURES: Eval: Patient Specific Functional Scale: 1.3 (can opener, making a fist, holding 2#+)  (Higher Score  =  Better Ability for the Selected Tasks)       UPPER EXTREMITY ROM     Shoulder to Wrist AROM Left eval  L 03/07/24 L 03/10/24 L  03/25/24 L  03/29/24  Forearm supination 64 75 90    Forearm pronation  79 90 90    Wrist flexion 33 45 55 55  60  Wrist extension 46 62 70 65 70  Functional dart thrower's motion (F-DTM) in ulnar flexion 18      F-DTM in radial extension  45      (Blank rows = not tested)  RD 30 UD 12  Hand AROM Left eval  Full Fist Ability (  or Gap to Distal Palmar Crease) Very loose full fist, esp loose in IF  Thumb Opposition  (Kapandji Scale)  6/10  (Blank rows = not tested) Opposition to distal fold of 5th - 03/07/24  UPPER EXTREMITY MMT:    Eval:  NT at eval due to recent and still healing injuries. Will be tested when appropriate.    HAND FUNCTION: Eval: Observed weakness in affected Lt hand.   03/07/24 Grip strength R 60; L 19 lbs, Lat grip R 13; L 10lbs , 3 point pinch R 13 , L 8 lbs  03/10/24 Grip strength R 60; L 25 lbs, Lat grip R 13; L 10lbs , 3 point pinch R 13 , L 10 lbs  03/25/24 Grip strength R 65; L 29 lbs, Lat grip R 11; L 9lbs , 3 point pinch R 13 , L 9 lbs     COORDINATION: Eval: Observed coordination impairments with  affected Lt hand.  9 Hole Peg Test Left:  29sec (27 sec is WFL; 22 is AVG)   SENSATION: Eval:  Light touch intact today  EDEMA:   Eval:  Mildly swollen in Lt hand and wrist today, 17.2cm circumferentially around left distal wrist crease compared to 15.2cm in Rt distal wrist crease  03/25/24: 16.5 cm L distal wrist crease   COGNITION: Eval: Overall cognitive status: WFL for evaluation today   OBSERVATIONS:   Eval: She has some typical swelling and soreness weakness and stiffness in the left hand wrist and arm now.   TODAY'S TREATMENT:  03/29/24   Pt reports she has stopped wearing the brace except ~10% of the time. Improved coordination for tying shoes, difficulty with pinch strength to push laundry detergent button.   Fluidotherapy done for 8 minutes to decrease stiffness and pain with wrist AROM during.   Soft tissue massage wrist and volar and dorsal forearm and metacarpals spread. Reviewed HEP, added table slides for wrist extension, reports improved stretch in standing. 20 reps, plan to do 2x daily.  Added light green theraputty for grip strengthening.  Added lateral pinch and pinch and twist with light blue putty.   Continue with tendon glides as well as thumb palmar abduction and opposition distal fold patient needed min verbal cueing for light motion   PATIENT EDUCATION: Education details: See tx section above for details  Person educated: Patient Education method: Verbal Instruction, Teach back, Handouts  Education comprehension: States and demonstrates understanding, Additional Education required       GOALS: Goals reviewed with patient? Yes   SHORT TERM GOALS: (STG required if POC>30 days) Target Date: 03/11/24  Pt will obtain protective, custom orthotic. Goal status: TBD/PRN  2.  Pt will demo/state understanding of initial HEP to improve pain levels and prerequisite motion. Goal status: INITIAL   LONG TERM GOALS: Target Date: 04/08/24  Pt will  improve functional ability by decreased impairment per PSFS assessment from 1.3 to 7 or better, for better quality of life. Goal status: INITIAL  2.  Pt will improve grip strength in left hand from unsafe to test to at least 25 lbs for functional use at home and in IADLs. Goal status: INITIAL  3.  Pt will improve A/ROM in left wrist flexion/extension from 33/46 degrees respectively to at least 55 degrees each, to have functional motion for tasks like reach and grasp.  Goal status: INITIAL  4.  Pt will improve strength in left wrist flexion/extension from apparent 3 -/5 MMT to at least 4+/5 MMT to have increased  functional ability to carry out selfcare and higher-level homecare tasks with less difficulty. Goal status: INITIAL  5.  Pt will improve coordination skills in left hand and arm, as seen by within functional limit score on nine-hole peg testing to have increased functional ability to carry out fine motor tasks (fasteners, etc.) and more complex, coordinated IADLs (meal prep, sports, etc.).  Goal status: INITIAL    ASSESSMENT:  CLINICAL IMPRESSION: Patient is a 73 y.o. female who was seen today for occupational therapy for stiffness, weakness, swelling, decreased strength and functional ability in the left hand and arm after distal radius fracture with conservative healing. Reports has increased wearing isotoner glove, educated on consistently doing contrast bath for ongoing edema mgmt. Updated HEP to add table slides for wrist extension, reports improved stretch in standing x 20 reps, plan to do 2x daily. Added light green theraputty for grip strengthening. Pt has light blue theraputty that she has not been using, added lateral pinch and pinch and twist to HEP with good demonstration in session. The patient will benefit from outpatient occupational therapy to decrease symptoms, improve functional upper extremity use, and increase quality of life.  PERFORMANCE DEFICITS: in functional  skills including ADLs, IADLs, coordination, dexterity, edema, ROM, strength, pain, fascial restrictions, Gross motor control, body mechanics, endurance, decreased knowledge of precautions, and UE functional use, cognitive skills including problem solving and safety awareness, and psychosocial skills including coping strategies, environmental adaptation, habits, and routines and behaviors.   IMPAIRMENTS: are limiting patient from ADLs, IADLs, rest and sleep, and leisure.   COMORBIDITIES: may have co-morbidities  that affects occupational performance. Patient will benefit from skilled OT to address above impairments and improve overall function.  MODIFICATION OR ASSISTANCE TO COMPLETE EVALUATION: No modification of tasks or assist necessary to complete an evaluation.  OT OCCUPATIONAL PROFILE AND HISTORY: Detailed assessment: Review of records and additional review of physical, cognitive, psychosocial history related to current functional performance.  CLINICAL DECISION MAKING: LOW - limited treatment options, no task modification necessary  REHAB POTENTIAL: Excellent  EVALUATION COMPLEXITY: Low      PLAN:  OT FREQUENCY: 1-2x/week  OT DURATION: 6 weeks through 04/08/24 and up to 10 total visits as needed   PLANNED INTERVENTIONS: 97535 self care/ADL training, 02889 therapeutic exercise, 97530 therapeutic activity, 97112 neuromuscular re-education, 97140 manual therapy, 97035 ultrasound, 97032 electrical stimulation (manual), 97760 Orthotic Initial, [id number] Orthotic/Prosthetic subsequent, compression bandaging, Dry needling, energy conservation, coping strategies training, and patient/family education  RECOMMENDED OTHER SERVICES: none now    CONSULTED AND AGREED WITH PLAN OF CARE: Patient  PLAN FOR NEXT SESSION:   Review HEP and recommendations, upgrade to light stretches in next session. After 8 weeks post fx introduce hand strength, at 9 weeks post fx also ok to introduce light FA,wrist  strength as well,as tolerated.    Elston Slot, M.S. OTR/L  03/29/24, 9:46 AM  ascom [phone]   03/29/2024, 9:46 AM

## 2024-04-01 ENCOUNTER — Encounter: Payer: Self-pay | Admitting: Occupational Therapy

## 2024-04-01 ENCOUNTER — Ambulatory Visit: Admitting: Occupational Therapy

## 2024-04-01 DIAGNOSIS — M6281 Muscle weakness (generalized): Secondary | ICD-10-CM | POA: Diagnosis not present

## 2024-04-01 DIAGNOSIS — M25632 Stiffness of left wrist, not elsewhere classified: Secondary | ICD-10-CM

## 2024-04-01 DIAGNOSIS — R600 Localized edema: Secondary | ICD-10-CM

## 2024-04-01 NOTE — Therapy (Signed)
 OUTPATIENT OCCUPATIONAL THERAPY ORTHO  TREATMENT  Patient Name: Mariah Meadows MRN: [id number] DOB:08/29/1950, 73 y.o., female Today's Date: 04/01/2024  PCP: Jyl FURY MD REFERRING PROVIDER:  Arlinda Buster, MD    END OF SESSION:  OT End of Session - 04/01/24 0953     Visit Number 9    Number of Visits 10    Date for Recertification  04/08/24    OT Start Time 0945    OT Stop Time 1030    OT Time Calculation (min) 45 min    Equipment Utilized During Treatment Compressive sleeve    Activity Tolerance Patient tolerated treatment well    Behavior During Therapy [facility] for tasks assessed/performed              Past Medical History:  Diagnosis Date   Breast cancer (HCC) 2002   lt lumpectomy/radiation LEFT   Personal history of radiation therapy    Skin cancer    Past Surgical History:  Procedure Laterality Date   BASAL CELL CARCINOMA EXCISION Left    BREAST BIOPSY Left 2002   lumpectomy/rad   BREAST LUMPECTOMY Left 2002   positive   COLONOSCOPY     COLONOSCOPY WITH PROPOFOL  N/A 05/27/2018   Procedure: COLONOSCOPY WITH PROPOFOL ;  Surgeon: Gaylyn Gladis PENNER, MD;  Location: [facility] ENDOSCOPY;  Service: Endoscopy;  Laterality: N/A;   There are no active problems to display for this patient.   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   PERTINENT HISTORY:  non op left wrist fx   PRECAUTIONS: None  RED FLAGS: None   WEIGHT BEARING RESTRICTIONS: Yes no weight more than 1-2# for next 3 weeks  PAIN:  Are you having pain? 1/10 ulnar wrist with pronation  FALLS: Has patient fallen in last 6 months? Yes. Number of falls 1  (this accident- not considered a fall risk)    PLOF: Independent  PATIENT GOALS: To improve stiffness, weakness, pain in left wrist and arm  NEXT MD VISIT: 10/8 follow up appnt    OBJECTIVE: (All objective assessments below are from initial evaluation on: 02/26/24 unless otherwise specified.)   HAND DOMINANCE: Right   ADLs: Overall ADLs: States decreased ability to grab, hold household objects, pain and difficulty to open containers, perform FMS tasks (manipulate fasteners on clothing), mild to moderate bathing problems as well.    FUNCTIONAL OUTCOME MEASURES: Eval: Patient Specific Functional Scale: 1.3 (can opener, making a fist, holding 2#+)  (Higher Score  =  Better Ability for the Selected Tasks)       UPPER EXTREMITY ROM     Shoulder to Wrist AROM Left eval  L 03/07/24 L 03/10/24 L  03/25/24 L  03/29/24 L 04/01/24  Forearm supination 64 75 90     Forearm pronation  79 90 90     Wrist flexion 33 45 55 55  60 65  Wrist extension 46 62 70 65 70 70  Functional dart thrower's motion (F-DTM) in ulnar flexion 18       F-DTM in radial extension  45       (Blank rows = not tested)  RD 30 UD 12  Hand AROM Left eval  Full  Fist Ability (or Gap to Distal Palmar Crease) Very loose full fist, esp loose in IF  Thumb Opposition  (Kapandji Scale)  6/10  (Blank rows = not tested) Opposition to distal fold of 5th - 03/07/24  UPPER EXTREMITY MMT:    Eval:  NT at eval due to recent and still healing injuries. Will be tested when appropriate.    HAND FUNCTION: Eval: Observed weakness in affected Lt hand.   03/07/24 Grip strength R 60; L 19 lbs, Lat grip R 13; L 10lbs , 3 point pinch R 13 , L 8 lbs  03/10/24 Grip strength R 60; L 25 lbs, Lat grip R 13; L 10lbs , 3 point pinch R 13 , L 10 lbs  03/25/24 Grip strength R 65; L 29 lbs, Lat grip R 11; L 9lbs , 3 point pinch R 13 , L 9 lbs   04/01/24 Grip strength R 65; L 30 lbs, Lat grip R 11; L 10lbs , 3 point pinch R 13 , L 9 lbs     COORDINATION: Eval: Observed coordination impairments with affected Lt hand.  9 Hole Peg Test Left:  29sec (27 sec is WFL; 22 is AVG)   SENSATION: Eval:  Light touch intact today  EDEMA:   Eval:  Mildly swollen in Lt hand and wrist today, 17.2cm circumferentially around left distal wrist crease compared to 15.2cm in Rt distal wrist crease  03/25/24: 16.5 cm L distal wrist crease  04/01/24: 16.5 cm L distal wrist crease   COGNITION: Eval: Overall cognitive status: WFL for evaluation today   OBSERVATIONS:   Eval: She has some typical swelling and soreness weakness and stiffness in the left hand wrist and arm now.   TODAY'S TREATMENT:  04/01/24   Fluidotherapy done for 8 minutes to decrease stiffness and pain with wrist AROM during.   5# forearm supination/pronation to simulate pouring half gallon of milk. Opens/closes door with L hand and no trunk compensation. Completes 1 set x 10 reps wall push ups, plan to add to HEP.  Achieves quadriped on matt table with no pain, 1 set x 3 reps knee push ups with tightness but no pain. Plan to resume yoga with modifications as needed.   Provided upgraded HEP with instructions to increase from light blue putty to green when pain free. Stop exercises if pain <3/10.   Access Code: [email or web address] URL: [email or web address] Date: 04/01/2024 Prepared by: Mariela Rex  Exercises - Putty Squeezes  - 1 x daily - 7 x weekly - 3 sets - 10 reps - Tip Pinch with Putty  - 1 x daily - 7 x weekly - 3 sets - 10 reps - Key Pinch with Putty  - 1 x daily - 7 x weekly - 3 sets - 10 reps - 3-Point Pinch with Putty  - 1 x daily - 7 x weekly - 3 sets - 10 reps - Rolling Putty on Table  - 1 x daily - 7 x weekly - 3 sets - 10 reps - Seated Finger Extension with Putty  - 1 x daily - 7 x weekly - 3 sets - 10 reps - Finger Exension with Putty  - 1 x daily - 7 x weekly - 3 sets - 10 reps - Finger Pinch and Pull with Putty  - 1 x daily - 7 x weekly - 3  sets - 10 reps - Thumb Radial Abduction with Putty Loop  - 1 x daily - 7 x weekly - 3 sets -  10 reps  PATIENT EDUCATION: Education details: See tx section above for details  Person educated: Patient Education method: Verbal Instruction, Teach back, Handouts  Education comprehension: States and demonstrates understanding, Additional Education required       GOALS: Goals reviewed with patient? Yes   SHORT TERM GOALS: (STG required if POC>30 days) Target Date: 03/11/24  Pt will obtain protective, custom orthotic. Goal status: TBD/PRN  2.  Pt will demo/state understanding of initial HEP to improve pain levels and prerequisite motion. Goal status: INITIAL   LONG TERM GOALS: Target Date: 04/08/24  Pt will improve functional ability by decreased impairment per PSFS assessment from 1.3 to 7 or better, for better quality of life. Goal status: INITIAL  2.  Pt will improve grip strength in left hand from unsafe to test to at least 25 lbs for functional use at home and in IADLs. Goal status: INITIAL  3.  Pt will improve A/ROM in left wrist flexion/extension from 33/46 degrees respectively to at least 55 degrees each, to have functional motion for tasks like reach and grasp.  Goal status: INITIAL  4.  Pt will improve strength in left wrist flexion/extension from apparent 3 -/5 MMT to at least 4+/5 MMT to have increased functional ability to carry out selfcare and higher-level homecare tasks with less difficulty. Goal status: INITIAL  5.  Pt will improve coordination skills in left hand and arm, as seen by within functional limit score on nine-hole peg testing to have increased functional ability to carry out fine motor tasks (fasteners, etc.) and more complex, coordinated IADLs (meal prep, sports, etc.).  Goal status: INITIAL    ASSESSMENT:  CLINICAL IMPRESSION: Patient is a 73 y.o. female who was seen today for occupational therapy for stiffness, weakness, swelling, decreased  strength and functional ability in the left hand and arm after distal radius fracture with conservative healing. 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 Is completing I/ADLs independently, only limitation lifting pots to pour food, continue to utilize joint protection strategies. Updated HEP to add wall push ups, plan to return to yoga with adaptations. L wrist flexion increased to 65*. HEP updated to progress putty exercises. Pt will follow up in 3 weeks if not progressing or increase in pain. The patient will benefit from outpatient occupational therapy to decrease symptoms, improve functional upper extremity use, and increase quality of life.  PERFORMANCE DEFICITS: in functional skills including ADLs, IADLs, coordination, dexterity, edema, ROM, strength, pain, fascial restrictions, Gross motor control, body mechanics, endurance, decreased knowledge of precautions, and UE functional use, cognitive skills including problem solving and safety awareness, and psychosocial skills including coping strategies, environmental adaptation, habits, and routines and behaviors.   IMPAIRMENTS: are limiting patient from ADLs, IADLs, rest and sleep, and leisure.   COMORBIDITIES: may have co-morbidities  that affects occupational performance. Patient will benefit from skilled OT to address above impairments and improve overall function.  MODIFICATION OR ASSISTANCE TO COMPLETE EVALUATION: No modification of tasks or assist necessary to complete an evaluation.  OT OCCUPATIONAL PROFILE AND HISTORY: Detailed assessment: Review of records and additional review of physical, cognitive, psychosocial history related to current functional performance.  CLINICAL DECISION MAKING: LOW - limited treatment options, no task modification necessary  REHAB POTENTIAL: Excellent  EVALUATION COMPLEXITY: Low      PLAN:  OT FREQUENCY: 1-2x/week  OT DURATION: 6 weeks through 04/08/24 and up to 10 total  visits as needed   PLANNED INTERVENTIONS: 97535 self care/ADL training,  97110 therapeutic exercise, 97530 therapeutic activity, 97112 neuromuscular re-education, 97140 manual therapy, 97035 ultrasound, 02967 electrical stimulation (manual), [id number] Orthotic Initial, [id number] Orthotic/Prosthetic subsequent, compression bandaging, Dry needling, energy conservation, coping strategies training, and patient/family education  RECOMMENDED OTHER SERVICES: none now    CONSULTED AND AGREED WITH PLAN OF CARE: Patient  PLAN FOR NEXT SESSION:   Review HEP and recommendations, upgrade to light stretches in next session. After 8 weeks post fx introduce hand strength, at 9 weeks post fx also ok to introduce light FA,wrist strength as well,as tolerated.    Elston Slot, M.S. OTR/L  04/01/24, 9:53 AM  ascom [phone]   04/01/2024, 9:53 AM

## 2024-04-04 ENCOUNTER — Ambulatory Visit: Admitting: Occupational Therapy

## 2024-04-07 ENCOUNTER — Encounter: Admitting: Occupational Therapy

## 2024-04-11 ENCOUNTER — Encounter: Admitting: Occupational Therapy

## 2024-04-12 ENCOUNTER — Ambulatory Visit: Admitting: Occupational Therapy

## 2024-04-14 ENCOUNTER — Encounter: Admitting: Occupational Therapy

## 2024-04-18 ENCOUNTER — Encounter: Payer: Self-pay | Admitting: Radiology
